# Patient Record
Sex: Female | Born: 1960 | Race: White | Hispanic: No | Marital: Married | Smoking: Former smoker
Health system: Southern US, Community
[De-identification: ages and names within clinical notes are randomized; demographics above are authoritative.]

## PROBLEM LIST (undated history)

## (undated) DIAGNOSIS — E785 Hyperlipidemia, unspecified: Secondary | ICD-10-CM

## (undated) DIAGNOSIS — M810 Age-related osteoporosis without current pathological fracture: Secondary | ICD-10-CM

## (undated) DIAGNOSIS — G43909 Migraine, unspecified, not intractable, without status migrainosus: Secondary | ICD-10-CM

## (undated) DIAGNOSIS — J45909 Unspecified asthma, uncomplicated: Secondary | ICD-10-CM

## (undated) DIAGNOSIS — K219 Gastro-esophageal reflux disease without esophagitis: Secondary | ICD-10-CM

## (undated) DIAGNOSIS — K589 Irritable bowel syndrome without diarrhea: Secondary | ICD-10-CM

## (undated) DIAGNOSIS — F329 Major depressive disorder, single episode, unspecified: Secondary | ICD-10-CM

## (undated) DIAGNOSIS — M1990 Unspecified osteoarthritis, unspecified site: Secondary | ICD-10-CM

## (undated) DIAGNOSIS — I10 Essential (primary) hypertension: Secondary | ICD-10-CM

## (undated) DIAGNOSIS — M797 Fibromyalgia: Secondary | ICD-10-CM

## (undated) DIAGNOSIS — K5790 Diverticulosis of intestine, part unspecified, without perforation or abscess without bleeding: Secondary | ICD-10-CM

## (undated) DIAGNOSIS — R519 Headache, unspecified: Secondary | ICD-10-CM

## (undated) DIAGNOSIS — F411 Generalized anxiety disorder: Secondary | ICD-10-CM

## (undated) DIAGNOSIS — G4730 Sleep apnea, unspecified: Secondary | ICD-10-CM

## (undated) DIAGNOSIS — G8929 Other chronic pain: Secondary | ICD-10-CM

## (undated) DIAGNOSIS — R51 Headache: Secondary | ICD-10-CM

## (undated) DIAGNOSIS — F32A Depression, unspecified: Secondary | ICD-10-CM

## (undated) DIAGNOSIS — K635 Polyp of colon: Secondary | ICD-10-CM

## (undated) DIAGNOSIS — F419 Anxiety disorder, unspecified: Secondary | ICD-10-CM

## (undated) DIAGNOSIS — T7840XA Allergy, unspecified, initial encounter: Secondary | ICD-10-CM

## (undated) HISTORY — DX: Unspecified asthma, uncomplicated: J45.909

## (undated) HISTORY — DX: Anxiety disorder, unspecified: F41.9

## (undated) HISTORY — DX: Gastro-esophageal reflux disease without esophagitis: K21.9

## (undated) HISTORY — PX: ELBOW SURGERY: SHX618

## (undated) HISTORY — DX: Headache: R51

## (undated) HISTORY — DX: Allergy, unspecified, initial encounter: T78.40XA

## (undated) HISTORY — PX: PARTIAL HYSTERECTOMY: SHX80

## (undated) HISTORY — DX: Sleep apnea, unspecified: G47.30

## (undated) HISTORY — DX: Diverticulosis of intestine, part unspecified, without perforation or abscess without bleeding: K57.90

## (undated) HISTORY — DX: Depression, unspecified: F32.A

## (undated) HISTORY — DX: Major depressive disorder, single episode, unspecified: F32.9

## (undated) HISTORY — DX: Polyp of colon: K63.5

## (undated) HISTORY — DX: Essential (primary) hypertension: I10

## (undated) HISTORY — DX: Age-related osteoporosis without current pathological fracture: M81.0

## (undated) HISTORY — PX: OTHER SURGICAL HISTORY: SHX169

## (undated) HISTORY — DX: Hyperlipidemia, unspecified: E78.5

## (undated) HISTORY — DX: Irritable bowel syndrome, unspecified: K58.9

## (undated) HISTORY — DX: Unspecified osteoarthritis, unspecified site: M19.90

## (undated) HISTORY — DX: Headache, unspecified: R51.9

## (undated) HISTORY — DX: Other chronic pain: G89.29

## (undated) HISTORY — PX: SHOULDER SURGERY: SHX246

## (undated) HISTORY — DX: Fibromyalgia: M79.7

---

## 1999-04-03 ENCOUNTER — Encounter: Payer: Self-pay | Admitting: Gastroenterology

## 1999-04-03 ENCOUNTER — Ambulatory Visit (HOSPITAL_COMMUNITY): Admission: RE | Admit: 1999-04-03 | Discharge: 1999-04-03 | Payer: Self-pay | Admitting: Gastroenterology

## 2001-06-21 HISTORY — PX: CHOLECYSTECTOMY: SHX55

## 2003-03-21 ENCOUNTER — Encounter: Payer: Self-pay | Admitting: *Deleted

## 2003-03-21 ENCOUNTER — Ambulatory Visit (HOSPITAL_COMMUNITY): Admission: RE | Admit: 2003-03-21 | Discharge: 2003-03-21 | Payer: Self-pay | Admitting: *Deleted

## 2003-04-02 ENCOUNTER — Inpatient Hospital Stay (HOSPITAL_COMMUNITY): Admission: AD | Admit: 2003-04-02 | Discharge: 2003-04-04 | Payer: Self-pay | Admitting: *Deleted

## 2003-04-02 ENCOUNTER — Encounter: Payer: Self-pay | Admitting: *Deleted

## 2003-04-04 ENCOUNTER — Encounter: Payer: Self-pay | Admitting: *Deleted

## 2003-04-11 ENCOUNTER — Encounter: Payer: Self-pay | Admitting: *Deleted

## 2003-04-11 ENCOUNTER — Ambulatory Visit (HOSPITAL_COMMUNITY): Admission: RE | Admit: 2003-04-11 | Discharge: 2003-04-11 | Payer: Self-pay | Admitting: *Deleted

## 2003-04-16 ENCOUNTER — Ambulatory Visit (HOSPITAL_COMMUNITY): Admission: RE | Admit: 2003-04-16 | Discharge: 2003-04-16 | Payer: Self-pay | Admitting: *Deleted

## 2004-03-10 ENCOUNTER — Emergency Department (HOSPITAL_COMMUNITY): Admission: EM | Admit: 2004-03-10 | Discharge: 2004-03-10 | Payer: Self-pay

## 2004-03-25 ENCOUNTER — Emergency Department (HOSPITAL_COMMUNITY): Admission: EM | Admit: 2004-03-25 | Discharge: 2004-03-25 | Payer: Self-pay | Admitting: Emergency Medicine

## 2005-05-12 ENCOUNTER — Encounter: Admission: RE | Admit: 2005-05-12 | Discharge: 2005-08-10 | Payer: Self-pay | Admitting: Anesthesiology

## 2005-05-18 ENCOUNTER — Ambulatory Visit: Payer: Self-pay | Admitting: Anesthesiology

## 2005-07-05 ENCOUNTER — Encounter
Admission: RE | Admit: 2005-07-05 | Discharge: 2005-10-03 | Payer: Self-pay | Admitting: Physical Medicine and Rehabilitation

## 2005-09-02 ENCOUNTER — Ambulatory Visit: Payer: Self-pay | Admitting: Physical Medicine and Rehabilitation

## 2005-09-17 ENCOUNTER — Encounter
Admission: RE | Admit: 2005-09-17 | Discharge: 2005-09-20 | Payer: Self-pay | Admitting: Physical Medicine and Rehabilitation

## 2005-09-30 ENCOUNTER — Encounter
Admission: RE | Admit: 2005-09-30 | Discharge: 2005-12-29 | Payer: Self-pay | Admitting: Physical Medicine and Rehabilitation

## 2005-10-11 ENCOUNTER — Encounter: Admission: RE | Admit: 2005-10-11 | Discharge: 2006-01-09 | Payer: Self-pay | Admitting: Anesthesiology

## 2005-10-11 ENCOUNTER — Ambulatory Visit: Payer: Self-pay | Admitting: Anesthesiology

## 2005-10-29 ENCOUNTER — Ambulatory Visit: Payer: Self-pay | Admitting: Physical Medicine and Rehabilitation

## 2005-11-18 ENCOUNTER — Encounter
Admission: RE | Admit: 2005-11-18 | Discharge: 2006-02-16 | Payer: Self-pay | Admitting: Physical Medicine and Rehabilitation

## 2005-12-28 ENCOUNTER — Encounter
Admission: RE | Admit: 2005-12-28 | Discharge: 2006-03-28 | Payer: Self-pay | Admitting: Physical Medicine and Rehabilitation

## 2005-12-28 ENCOUNTER — Ambulatory Visit: Payer: Self-pay | Admitting: Physical Medicine and Rehabilitation

## 2006-01-25 ENCOUNTER — Ambulatory Visit: Payer: Self-pay | Admitting: Physical Medicine and Rehabilitation

## 2006-01-28 ENCOUNTER — Encounter: Admission: RE | Admit: 2006-01-28 | Discharge: 2006-04-28 | Payer: Self-pay | Admitting: Anesthesiology

## 2006-02-01 ENCOUNTER — Ambulatory Visit: Payer: Self-pay | Admitting: Anesthesiology

## 2006-02-26 ENCOUNTER — Encounter
Admission: RE | Admit: 2006-02-26 | Discharge: 2006-02-26 | Payer: Self-pay | Admitting: Physical Medicine and Rehabilitation

## 2006-03-10 ENCOUNTER — Ambulatory Visit: Payer: Self-pay | Admitting: Physical Medicine and Rehabilitation

## 2006-03-29 ENCOUNTER — Encounter
Admission: RE | Admit: 2006-03-29 | Discharge: 2006-06-27 | Payer: Self-pay | Admitting: Physical Medicine and Rehabilitation

## 2006-04-05 ENCOUNTER — Ambulatory Visit: Payer: Self-pay | Admitting: Anesthesiology

## 2006-04-13 ENCOUNTER — Inpatient Hospital Stay (HOSPITAL_COMMUNITY): Admission: RE | Admit: 2006-04-13 | Discharge: 2006-04-14 | Payer: Self-pay | Admitting: Neurosurgery

## 2006-04-29 ENCOUNTER — Ambulatory Visit: Payer: Self-pay | Admitting: Physical Medicine and Rehabilitation

## 2006-05-22 ENCOUNTER — Encounter: Admission: RE | Admit: 2006-05-22 | Discharge: 2006-05-22 | Payer: Self-pay | Admitting: Neurosurgery

## 2006-05-30 ENCOUNTER — Ambulatory Visit: Payer: Self-pay | Admitting: Physical Medicine and Rehabilitation

## 2006-06-13 ENCOUNTER — Encounter: Admission: RE | Admit: 2006-06-13 | Discharge: 2006-06-13 | Payer: Self-pay | Admitting: Internal Medicine

## 2006-06-28 ENCOUNTER — Encounter
Admission: RE | Admit: 2006-06-28 | Discharge: 2006-09-26 | Payer: Self-pay | Admitting: Physical Medicine and Rehabilitation

## 2006-07-01 ENCOUNTER — Ambulatory Visit: Payer: Self-pay | Admitting: Physical Medicine and Rehabilitation

## 2006-08-26 ENCOUNTER — Ambulatory Visit: Payer: Self-pay | Admitting: Physical Medicine and Rehabilitation

## 2006-10-10 ENCOUNTER — Ambulatory Visit: Payer: Self-pay | Admitting: Physical Medicine and Rehabilitation

## 2006-10-10 ENCOUNTER — Encounter
Admission: RE | Admit: 2006-10-10 | Discharge: 2007-01-08 | Payer: Self-pay | Admitting: Physical Medicine and Rehabilitation

## 2006-12-04 ENCOUNTER — Encounter: Admission: RE | Admit: 2006-12-04 | Discharge: 2006-12-04 | Payer: Self-pay | Admitting: Unknown Physician Specialty

## 2006-12-05 ENCOUNTER — Ambulatory Visit: Payer: Self-pay | Admitting: Physical Medicine and Rehabilitation

## 2007-02-08 ENCOUNTER — Encounter: Admission: RE | Admit: 2007-02-08 | Discharge: 2007-02-08 | Payer: Self-pay | Admitting: Neurology

## 2007-02-14 ENCOUNTER — Ambulatory Visit: Payer: Self-pay | Admitting: Physical Medicine and Rehabilitation

## 2007-02-14 ENCOUNTER — Encounter
Admission: RE | Admit: 2007-02-14 | Discharge: 2007-02-15 | Payer: Self-pay | Admitting: Physical Medicine and Rehabilitation

## 2007-03-16 ENCOUNTER — Ambulatory Visit (HOSPITAL_BASED_OUTPATIENT_CLINIC_OR_DEPARTMENT_OTHER): Admission: RE | Admit: 2007-03-16 | Discharge: 2007-03-17 | Payer: Self-pay | Admitting: Orthopedic Surgery

## 2007-03-23 ENCOUNTER — Encounter: Admission: RE | Admit: 2007-03-23 | Discharge: 2007-03-24 | Payer: Self-pay | Admitting: Orthopedic Surgery

## 2007-04-11 ENCOUNTER — Encounter
Admission: RE | Admit: 2007-04-11 | Discharge: 2007-04-14 | Payer: Self-pay | Admitting: Physical Medicine and Rehabilitation

## 2007-04-14 ENCOUNTER — Ambulatory Visit: Payer: Self-pay | Admitting: Physical Medicine and Rehabilitation

## 2007-05-25 ENCOUNTER — Ambulatory Visit: Payer: Self-pay | Admitting: Physical Medicine and Rehabilitation

## 2007-05-26 ENCOUNTER — Ambulatory Visit: Payer: Self-pay | Admitting: Physical Medicine and Rehabilitation

## 2007-06-23 ENCOUNTER — Encounter
Admission: RE | Admit: 2007-06-23 | Discharge: 2007-09-21 | Payer: Self-pay | Admitting: Physical Medicine and Rehabilitation

## 2007-07-18 ENCOUNTER — Ambulatory Visit: Payer: Self-pay | Admitting: Physical Medicine and Rehabilitation

## 2007-09-04 IMAGING — CR DG CERVICAL SPINE 2 OR 3 VIEWS
4 series · 4 of 4 positions shown · non-contrast
Comparison: 04/13/06.

CLINICAL DATA: Neck pain.
 CERVICAL SPINE ? THREE LATERAL VIEWS (NEUTRAL, FLEXION, AND EXTENSION):

[view not recorded (1 of 4)]
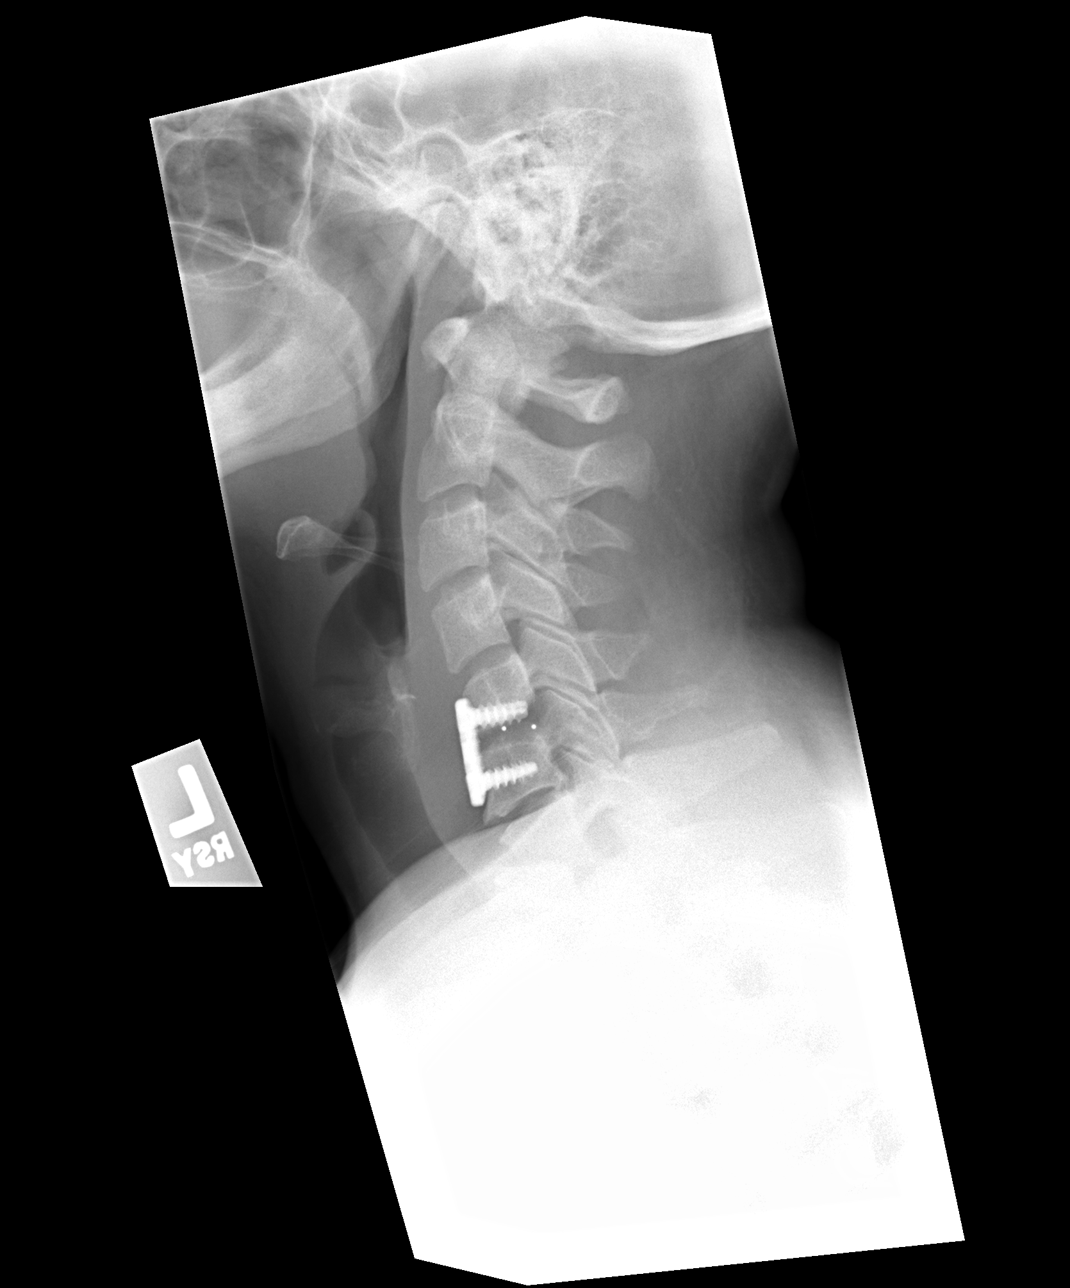

[view not recorded (2 of 4)]
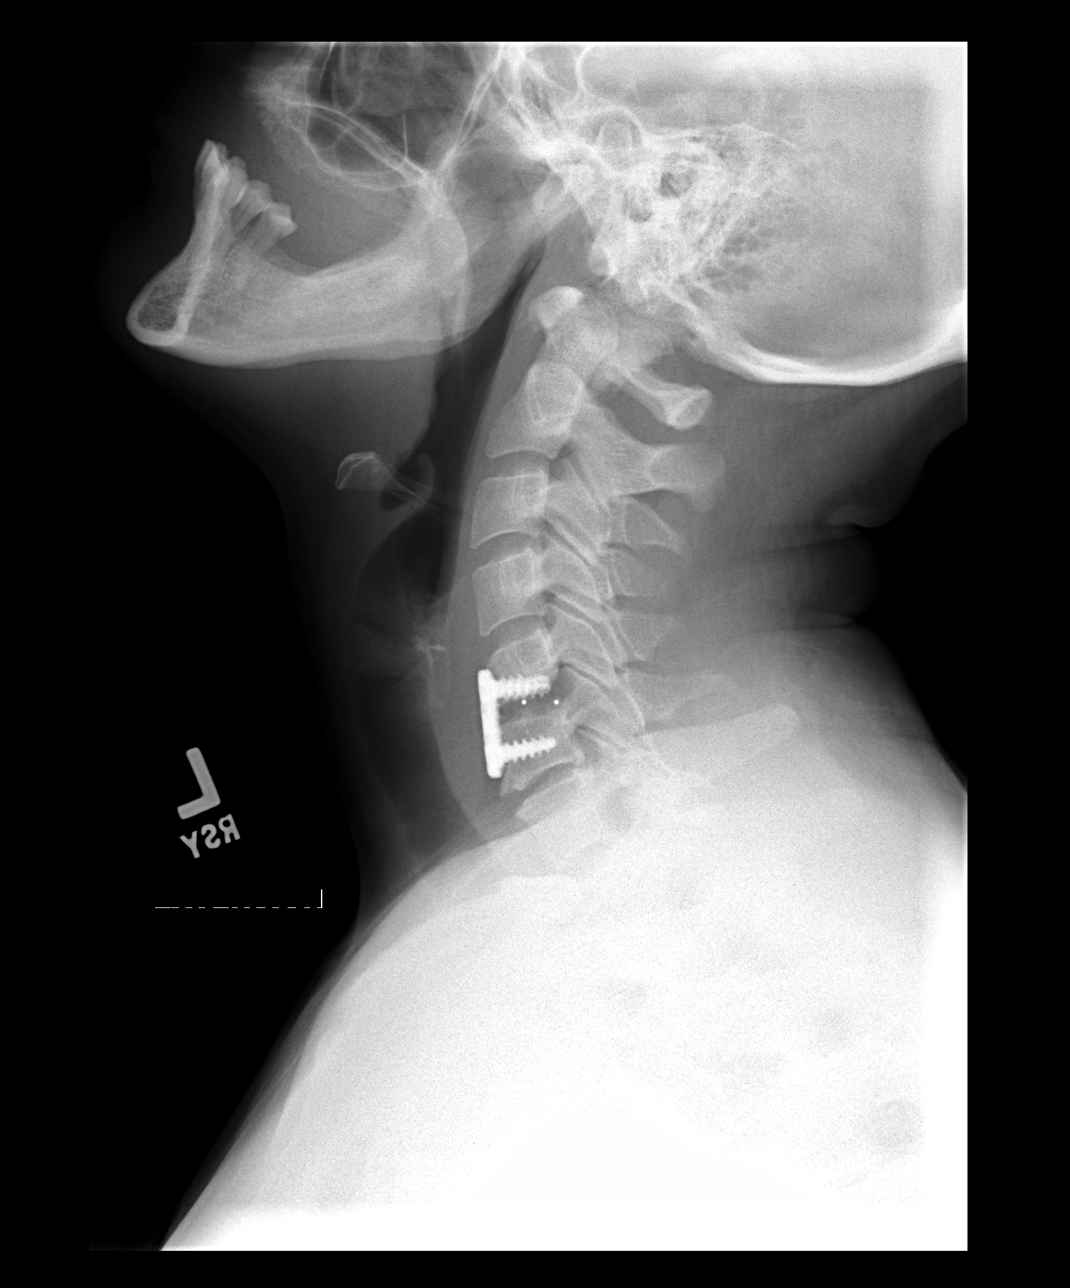

[view not recorded (3 of 4)]
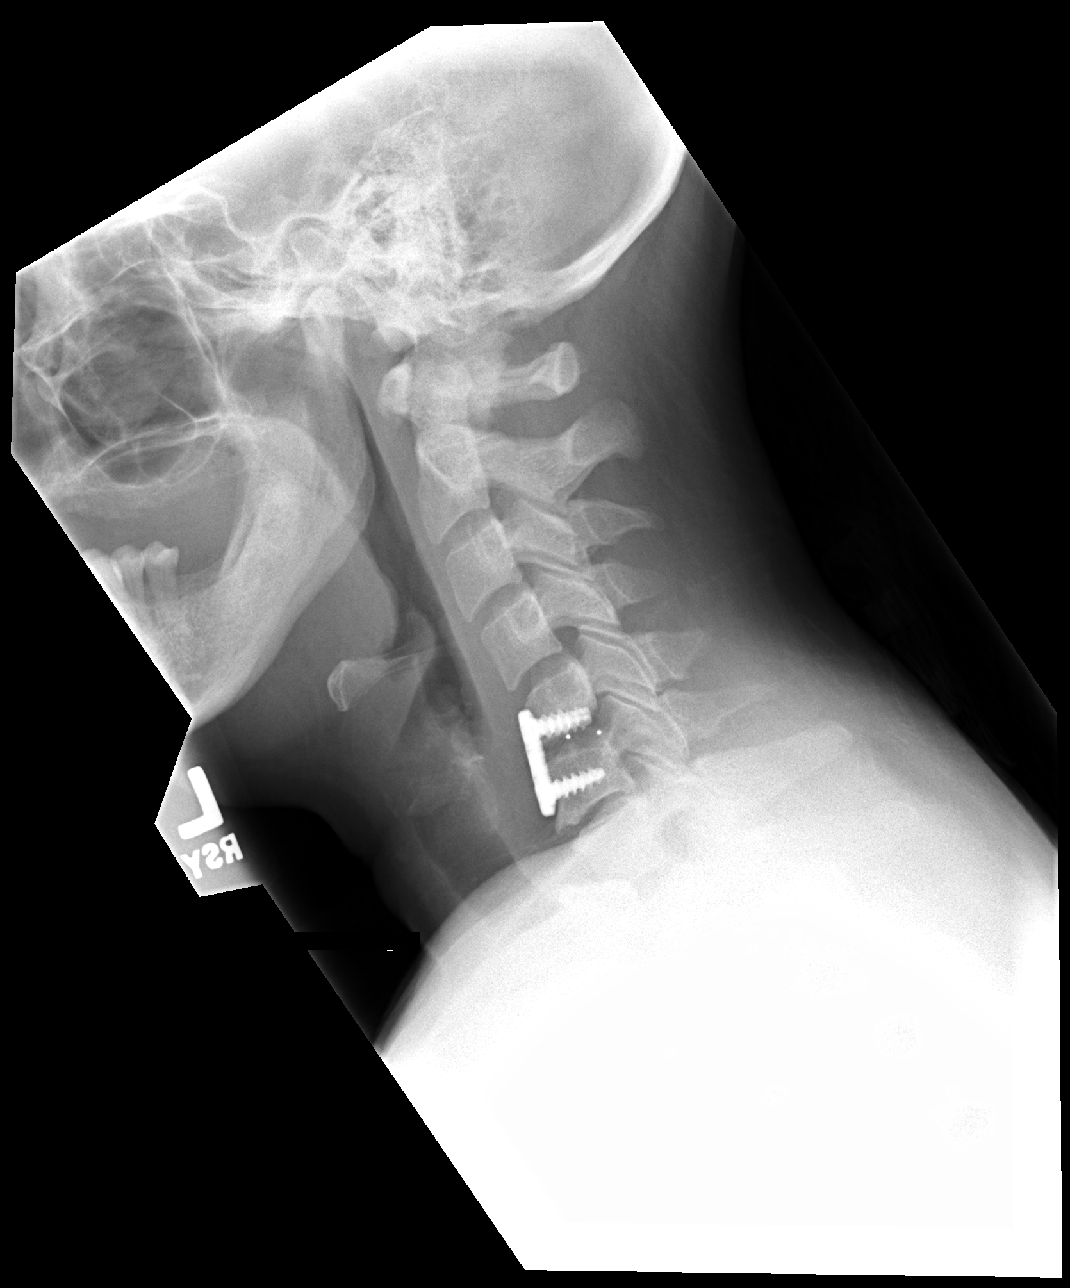

[view not recorded (4 of 4)]
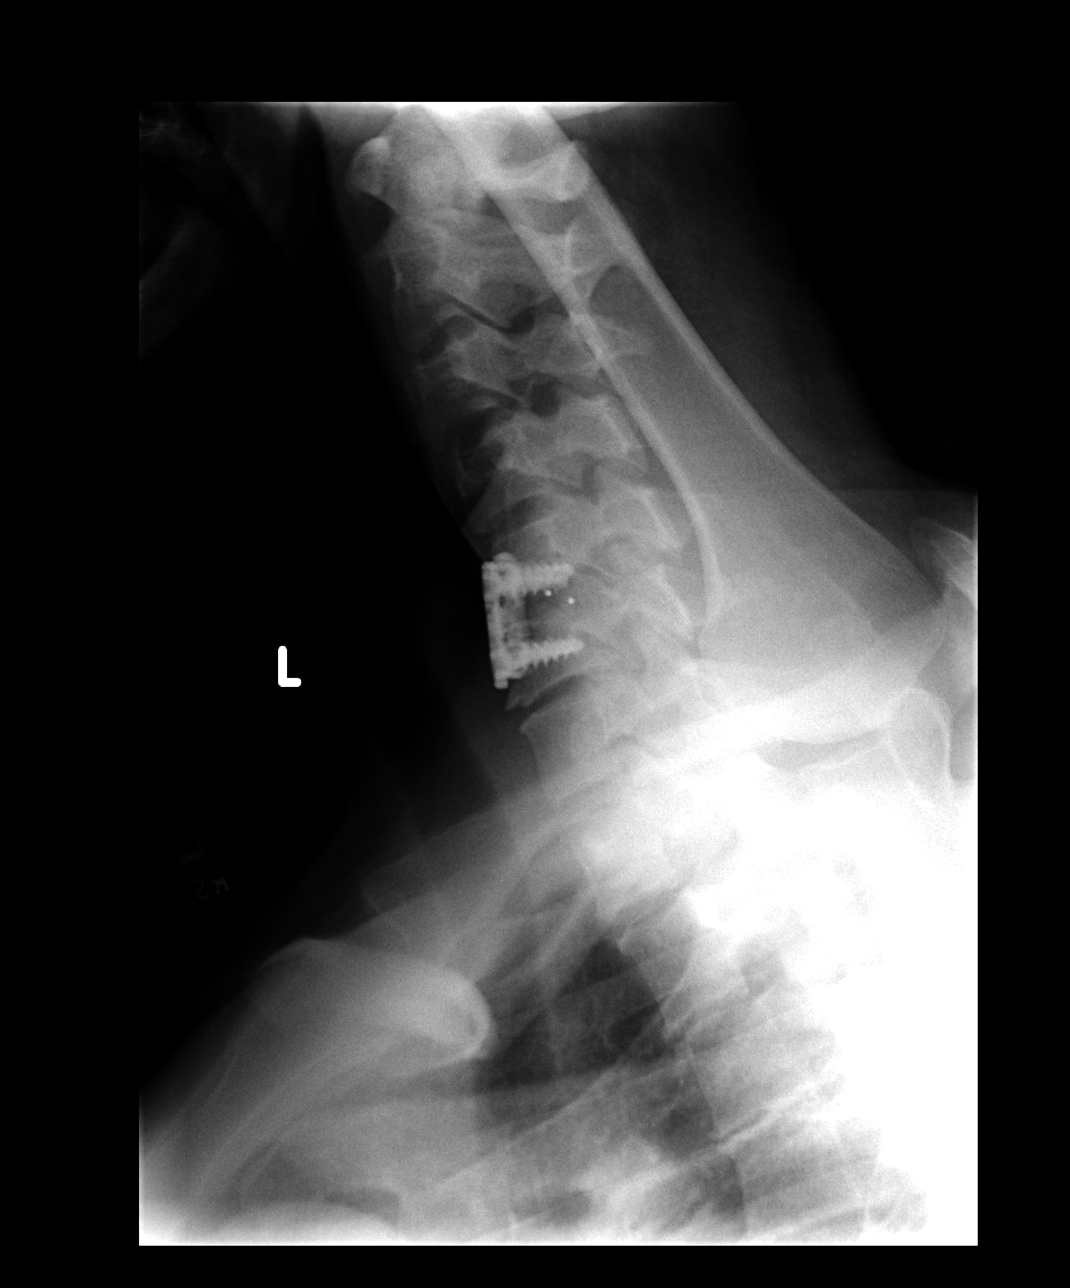

[4 of 4 positions shown; findings below may reference images not displayed]

FINDINGS: Anterior plates and screws at C5 and C6 are stable in configuration and position compared to the prior study.  A bone cage in the C5-6 disk space is stable.  Screws in C6 are appropriately positioned. The screws in the C5 area project just inferior to the vertebral body and are in the disk space.  There is anatomic alignment of the vertebral bodies.  Flexion and extension views demonstrate slightly increased mobility at the C4-5 disk as would be expected.  There is no gross instability.
IMPRESSION: 1.  Increased mobility at the C4-5 disk space as would be expected after C5-6 fusion.
 2.  C5-6 fusion and hardware are completely stable in position.  However, screws in C5 area project inferior to the C5 vertebral body and into the disk space.  These screws likely traverse the inferior aspect of C5 anteriorly but then are directed slightly inferiorly.

## 2007-09-11 ENCOUNTER — Ambulatory Visit: Payer: Self-pay | Admitting: Physical Medicine and Rehabilitation

## 2007-09-11 ENCOUNTER — Encounter
Admission: RE | Admit: 2007-09-11 | Discharge: 2007-12-10 | Payer: Self-pay | Admitting: Physical Medicine and Rehabilitation

## 2007-10-11 ENCOUNTER — Ambulatory Visit: Payer: Self-pay | Admitting: Physical Medicine and Rehabilitation

## 2007-11-08 ENCOUNTER — Ambulatory Visit: Payer: Self-pay | Admitting: Physical Medicine & Rehabilitation

## 2007-12-05 ENCOUNTER — Encounter
Admission: RE | Admit: 2007-12-05 | Discharge: 2008-03-04 | Payer: Self-pay | Admitting: Physical Medicine and Rehabilitation

## 2007-12-13 ENCOUNTER — Ambulatory Visit: Payer: Self-pay | Admitting: Physical Medicine and Rehabilitation

## 2008-02-07 ENCOUNTER — Ambulatory Visit: Payer: Self-pay | Admitting: Physical Medicine and Rehabilitation

## 2008-03-05 ENCOUNTER — Encounter
Admission: RE | Admit: 2008-03-05 | Discharge: 2008-06-03 | Payer: Self-pay | Admitting: Physical Medicine and Rehabilitation

## 2008-03-06 ENCOUNTER — Ambulatory Visit: Payer: Self-pay | Admitting: Physical Medicine and Rehabilitation

## 2008-04-05 ENCOUNTER — Ambulatory Visit: Payer: Self-pay | Admitting: Physical Medicine and Rehabilitation

## 2008-04-17 ENCOUNTER — Encounter
Admission: RE | Admit: 2008-04-17 | Discharge: 2008-04-17 | Payer: Self-pay | Admitting: Physical Medicine and Rehabilitation

## 2008-05-06 ENCOUNTER — Ambulatory Visit: Payer: Self-pay | Admitting: Physical Medicine and Rehabilitation

## 2008-05-13 ENCOUNTER — Ambulatory Visit: Payer: Self-pay | Admitting: Physical Medicine and Rehabilitation

## 2008-05-15 ENCOUNTER — Encounter
Admission: RE | Admit: 2008-05-15 | Discharge: 2008-05-15 | Payer: Self-pay | Admitting: Physical Medicine and Rehabilitation

## 2008-06-27 ENCOUNTER — Encounter
Admission: RE | Admit: 2008-06-27 | Discharge: 2008-08-21 | Payer: Self-pay | Admitting: Physical Medicine and Rehabilitation

## 2008-06-28 ENCOUNTER — Ambulatory Visit: Payer: Self-pay | Admitting: Physical Medicine and Rehabilitation

## 2008-07-05 ENCOUNTER — Ambulatory Visit (HOSPITAL_COMMUNITY): Admission: RE | Admit: 2008-07-05 | Discharge: 2008-07-05 | Payer: Self-pay | Admitting: Neurosurgery

## 2008-08-21 ENCOUNTER — Ambulatory Visit: Payer: Self-pay | Admitting: Physical Medicine and Rehabilitation

## 2008-09-16 ENCOUNTER — Encounter
Admission: RE | Admit: 2008-09-16 | Discharge: 2008-09-23 | Payer: Self-pay | Admitting: Physical Medicine & Rehabilitation

## 2008-09-23 ENCOUNTER — Ambulatory Visit: Payer: Self-pay | Admitting: Physical Medicine & Rehabilitation

## 2008-09-24 ENCOUNTER — Encounter
Admission: RE | Admit: 2008-09-24 | Discharge: 2008-11-01 | Payer: Self-pay | Admitting: Physical Medicine and Rehabilitation

## 2008-09-27 ENCOUNTER — Ambulatory Visit: Payer: Self-pay | Admitting: Physical Medicine and Rehabilitation

## 2008-10-04 ENCOUNTER — Encounter: Admission: RE | Admit: 2008-10-04 | Discharge: 2008-10-04 | Payer: Self-pay | Admitting: Sports Medicine

## 2008-10-23 ENCOUNTER — Ambulatory Visit (HOSPITAL_BASED_OUTPATIENT_CLINIC_OR_DEPARTMENT_OTHER): Admission: RE | Admit: 2008-10-23 | Discharge: 2008-10-24 | Payer: Self-pay | Admitting: Orthopedic Surgery

## 2008-11-01 ENCOUNTER — Ambulatory Visit: Payer: Self-pay | Admitting: Physical Medicine and Rehabilitation

## 2009-02-20 ENCOUNTER — Encounter: Admission: RE | Admit: 2009-02-20 | Discharge: 2009-02-20 | Payer: Self-pay | Admitting: Orthopedic Surgery

## 2009-02-28 ENCOUNTER — Ambulatory Visit: Payer: Self-pay | Admitting: Physical Medicine and Rehabilitation

## 2009-02-28 ENCOUNTER — Encounter
Admission: RE | Admit: 2009-02-28 | Discharge: 2009-02-28 | Payer: Self-pay | Admitting: Physical Medicine and Rehabilitation

## 2009-04-10 ENCOUNTER — Encounter: Admission: RE | Admit: 2009-04-10 | Discharge: 2009-04-10 | Payer: Self-pay | Admitting: Orthopedic Surgery

## 2009-05-19 ENCOUNTER — Encounter: Admission: RE | Admit: 2009-05-19 | Discharge: 2009-05-19 | Payer: Self-pay | Admitting: Orthopedic Surgery

## 2010-05-09 ENCOUNTER — Emergency Department (HOSPITAL_COMMUNITY): Admission: EM | Admit: 2010-05-09 | Discharge: 2010-05-09 | Payer: Self-pay | Admitting: Emergency Medicine

## 2010-07-11 ENCOUNTER — Encounter: Payer: Self-pay | Admitting: *Deleted

## 2010-07-12 ENCOUNTER — Encounter: Payer: Self-pay | Admitting: Orthopedic Surgery

## 2010-07-12 ENCOUNTER — Encounter: Payer: Self-pay | Admitting: Family Medicine

## 2010-07-21 ENCOUNTER — Ambulatory Visit (HOSPITAL_COMMUNITY)
Admission: RE | Admit: 2010-07-21 | Discharge: 2010-07-21 | Payer: Self-pay | Source: Home / Self Care | Attending: Psychiatry | Admitting: Psychiatry

## 2010-07-24 ENCOUNTER — Ambulatory Visit (HOSPITAL_COMMUNITY): Payer: Medicare Other | Admitting: Physician Assistant

## 2010-07-24 DIAGNOSIS — F4310 Post-traumatic stress disorder, unspecified: Secondary | ICD-10-CM

## 2010-08-25 ENCOUNTER — Encounter (HOSPITAL_COMMUNITY): Payer: Medicare Other | Admitting: Physician Assistant

## 2010-08-26 ENCOUNTER — Encounter (HOSPITAL_COMMUNITY): Payer: Medicare Other | Admitting: Physician Assistant

## 2010-08-26 DIAGNOSIS — F410 Panic disorder [episodic paroxysmal anxiety] without agoraphobia: Secondary | ICD-10-CM

## 2010-08-26 DIAGNOSIS — F329 Major depressive disorder, single episode, unspecified: Secondary | ICD-10-CM

## 2010-09-01 LAB — DIFFERENTIAL
Eosinophils Absolute: 0.1 10*3/uL (ref 0.0–0.7)
Eosinophils Relative: 3 % (ref 0–5)
Lymphocytes Relative: 39 % (ref 12–46)
Lymphs Abs: 1.9 10*3/uL (ref 0.7–4.0)
Monocytes Absolute: 0.6 10*3/uL (ref 0.1–1.0)
Monocytes Relative: 12 % (ref 3–12)

## 2010-09-01 LAB — CBC
HCT: 35.7 % — ABNORMAL LOW (ref 36.0–46.0)
Hemoglobin: 11.6 g/dL — ABNORMAL LOW (ref 12.0–15.0)
MCH: 27.9 pg (ref 26.0–34.0)
MCV: 85.8 fL (ref 78.0–100.0)
Platelets: 177 10*3/uL (ref 150–400)
RBC: 4.16 MIL/uL (ref 3.87–5.11)
WBC: 4.8 10*3/uL (ref 4.0–10.5)

## 2010-09-01 LAB — POCT I-STAT, CHEM 8
BUN: 12 mg/dL (ref 6–23)
Calcium, Ion: 1.12 mmol/L (ref 1.12–1.32)
Creatinine, Ser: 0.9 mg/dL (ref 0.4–1.2)
Glucose, Bld: 96 mg/dL (ref 70–99)
Hemoglobin: 12.6 g/dL (ref 12.0–15.0)
Sodium: 143 mEq/L (ref 135–145)
TCO2: 26 mmol/L (ref 0–100)

## 2010-09-23 ENCOUNTER — Encounter (HOSPITAL_COMMUNITY): Payer: Medicare Other | Admitting: Physician Assistant

## 2010-09-23 DIAGNOSIS — F410 Panic disorder [episodic paroxysmal anxiety] without agoraphobia: Secondary | ICD-10-CM

## 2010-09-23 DIAGNOSIS — F329 Major depressive disorder, single episode, unspecified: Secondary | ICD-10-CM

## 2010-11-03 ENCOUNTER — Encounter (HOSPITAL_COMMUNITY): Payer: Medicare Other | Admitting: Physician Assistant

## 2010-11-03 DIAGNOSIS — F410 Panic disorder [episodic paroxysmal anxiety] without agoraphobia: Secondary | ICD-10-CM

## 2010-11-03 DIAGNOSIS — F329 Major depressive disorder, single episode, unspecified: Secondary | ICD-10-CM

## 2010-11-03 NOTE — Assessment & Plan Note (Signed)
Amber Harrell is a 50 year old married woman who has followed in [facility] for chronic cervicalgia and bilateral shoulder  pain.   She was last seen by me on May 13, 2008.   In the interim, she has followed up with Dr. Tressie Stalker who has  ordered a myelogram on July 06, 2007, next week.   Amber Harrell continues to have fairly severe pain in her neck and her  shoulders.  Pain radiates down both upper extremities.  She reports  numbness and tingling in both hands and arms.  She is also complaining  of some tingling and pain in the lower extremities as well now.   She is reporting some frequency, symptoms with respect to bladder, has  no problems controlling bowel.   She reports poor sleep.  Pain is described as constant, sharp burning,  tingling, and aching in nature.  She reports pain is worse with bending,  prolonged sitting, improves with heat, medication for a little while.  She is getting fair relief with current meds.   Medications prescribed through this clinic include the following:  1. Norco 5/325 one p.o. q.i.d. p.r.n. pain.  These were filled for her      on June 18, 2008.  She has 112 pills left today.  2. Topamax 25 mg 2 tablets p.o. b.i.d.  She is currently off Celebrex.  3. Trazodone 50 mg one p.o. nightly  4. Flexeril 5 mg 1 p.o. b.i.d.   Functional status is as follows.  She is able to walk at least 15-20  minutes at a time.  She is able to drive.  She is able to climb stairs.  She is independent with feeding, bathing, and toileting, needs some  assistance with upper extremity dressing, needs assistance with meal  prep and household duties.   Review of systems is positive for depression, anxiety, denies suicidal  ideation, admits to numbness, tingling, and bladder frequency.   No other changes in past medical, social or family history.  She has  been healthy since I saw her last other than the neck and arm pain.   PHYSICAL  EXAMINATION:  Blood pressure is 125/68, pulse 56, respiration  18, 98% saturated on room air.  She is a well-developed, nourished woman  who appears her stated age.  She is oriented x3.  Speech is clear.  Affect is bright.  She is alert, cooperative, and pleasant.  She follows  commands without difficulty and answers questions appropriately.  She  does display pain behaviors throughout the exam.  Cranial nerves are  grossly intact.  Coordination is intact.  Reflexes are slightly brisk in  upper and lower extremities.  No abnormal tone is noted.  No clonus is  noted.  Decreased sensation to pinprick in the left middle finger and  over the left bicipital region.  Otherwise, intact in the right upper  extremity and bilateral lower extremities.  Motor strength is in the  4+/5 range in the upper extremities, unable to give full effort because  of pain in the neck and shoulders today, distally in the upper  extremities she is 5/5.  Lower extremity strength is 5/5.   Transitioning from sitting to standing is done without difficulty.  Gait  is nonantalgic.  Balance is quite good.  In fact, she is wearing rocker  bottom she use today, apparently a gift from her husband.  She is able  to tandem walk in these shoes and also able to  perform an adequate  Romberg test.   Limitations are noted in cervical range of motion in all planes,  especially limited is left rotation and forward flexion.  Left shoulder  range of motion is limited to less than 90 degrees.  Right shoulder  motion is intact with abduction and internal and external rotation.   IMPRESSION:  1. History of cervicalgia status post cervical decompression, Dr.      Lovell Sheehan, C5-6, May 14, 2006.  2. Degenerative changes also noted at other levels per MRI recently.  3. Bilateral upper extremity pain.  4. History of migraines.  5. History of insomnia.  6. History of anxiety.   PLAN:  The patient will be following back up with Dr.  Lovell Sheehan.  She has  a myelogram planned.  We will refill the following medications for her  today:  Topamax 25 mg 2 p.o. b.i.d. #120 with 2 refills and Flexeril 5  mg 1 p.o. b.i.d. #60.  She does not need refill on her hydrocodone at  this time.  Anticipate she will need a refill near the end of the month.  Ms. Stobaugh has been stable on the above medications.  She has been  taking them as prescribed.  No aberrant behavior has been observed.           ______________________________  Brantley Stage, M.D.     DMK/MedQ  D:  06/28/2008 12:10:46  T:  06/29/2008 01:24:21  Job #:  [id number]

## 2010-11-03 NOTE — Assessment & Plan Note (Signed)
Amber Harrell is a 50 year old married female who is followed in our [facility] for cervicalgia and right shoulder pain.   She was last seen by me on Nov 08, 2007.  She is back in today for  refill of her medications for Topamax.   She states, over the last couple of days, she has experienced some  increase in her shoulder pain and some tingling down the right upper  extremity.  She has had neck cramps as well for about a week, and she  has not been sleeping quite as well.  She believes the neck pain and arm  pain started after she was helping to clean out her church this spring.  She has no new problems with control of bowel or bladder.  Denies any  new problems or trouble walking.   Average pain is about a 7 on a scale of 10 described as dull and aching,  interfering significantly with activity, sleep tends to be poor  recently.  She gets fair relief with current meds that she is  prescribed.   MEDICATIONS:  Medications, which she takes through our clinic include:  1. Celebrex p.r.n.  She is out of this medication.  2. Topamax 25 mg 3 times a day.  3. Trazodone 15 mg one p.o. nightly.   Functional status is as follows.   She is able to walk about 15 minutes at a time.  She is able to climb  stairs, and she is driving.  She is independent with self-care.  She is  a high level functioning individual, is independent with meal prep,  shopping, needs some help with heavier household tasks.   REVIEW OF SYSTEMS:  She initially checked the bowel control problem box  in our health and history form, but she states that she is really not  having problems with this, does admit to some depression and anxiety.  Denies suicidal ideation, reports that she had CT scan of her lungs  approximately Nov 06, 2007, and was told by her cardiologist, Dr. Dulce Sellar  that she has emphysema now.   Otherwise, no new changes in past medical, social, or family history  since last visit  other than what I just described.   PHYSICAL EXAMINATION:  Blood pressure is 112/69, pulse 59, respirations  18, 100% saturation on room air.  She is a well-developed, well-  nourished woman who does not appear in any distress.  She is oriented  x3.  Speech is clear.  Affect is bright.  She is alert and cooperative  and pleasant.  She follows commands easily.   Cranial nerves are grossly intact.  Her motor strength is 5/5 without  focal deficit.  She has intact and symmetric reflexes in the upper and  lower extremities without abnormal tone or clonus.  Toes are downgoing.   Coordination is grossly intact.  Tandem gait, Romberg test all performed  adequately, gait is normal.   Musculoskeletal exam reveals right shoulder range of motion to 110  degrees.  She reports some discomfort with this.   She has mild limitations in cervical range of motion, quite a bit of  tenderness in the cervical paraspinal musculature as well as the upper  trapezius muscles.   IMPRESSION:  1. Cervicalgia.  2. Chronic right shoulder pain, status post surgery x2.  3. History of migraines.  4. Insomnia.   PLAN:  I would like her to wear her cervical collar over the next week  while  she is up.  We will start her back on Celebrex 200 mg daily for  the next 7-10 days, prescription was given for #30.  We will refill her  Topamax 25 mg one p.o. t.i.d. #90 with 3 refills.  She does not need a  refill on the trazodone at this time.  She will let us know if she  continues to have problems.  Otherwise, we will see her back in 2  months.           ______________________________  Brantley Stage, M.D.     DMK/MedQ  D:  12/13/2007 11:33:28  T:  12/14/2007 12:50:20  Job #:  [id number]

## 2010-11-03 NOTE — Assessment & Plan Note (Signed)
Amber Harrell is a 50 year old married woman who is followed in our [facility] for chronic cervicalgia and shoulder pain.   She was last seen by me on May 06, 2008, which was last week.  At  that time, she had had increasing pain in the cervical region with  radiation to the shoulders bilaterally, especially to the left bicipital  region.   She has a history significant for cervical decompression by Dr. Lovell Sheehan  at C5-C6.  She has history of degenerative changes noted at C6-C7 as  well.   She has now about an 8- to 10-week history of significant pain in the  cervical and shoulder or parascapular regions.  Last week, she was  started on a Medrol Dosepak, and an MRI was ordered of her cervical  spine; however, she was ill with flu symptoms and did not obtain the  cervical spine MRI.  This was then rescheduled for May 16, 2008.  She has also made an appointment to see Dr. Lovell Sheehan on May 15, 2008.   She states her pain is about a 10 on a scale of 10; it is constant  throughout the day without really waxing or waning.   She denies any new weakness, any new numbness or tingling.  Pain is  really not worsened by any particular activity.  She does report some  improvement of her pain with medications as well as TENS unit and  Lidoderm patches.   Medications, which are prescribed through this clinic for her include  Topamax 25 mg 1 p.o. q.8 h., trazodone 50 mg 1 p.o. at bedtime, p.r.n.  Voltaren gel, Flexeril 5 mg 1 p.o. b.i.d. as well as Norco as prescribed  for last week, she was given 32 tablets to take 1 p.o. q.i.d.   FUNCTIONAL STATUS:  She is limited being upright now.  She can walk  approximately 10 minutes.  She is able to climb stairs.  She is  currently not driving.  She requires some assistance with upper  extremity dressing, getting her through her shirt and washing her hair.   Denies problems controlling bowel or bladder.  Admits to  depression and  anxiety.  Denies suicidal ideation.   REVIEW OF SYSTEMS:  Positive for recent vomiting and constipation,  abdominal pain, respiratory infections, and coughing.  She does maintain  contact with her primary care physician.   No other changes in past medical, social, or family history since our  last visit last week.   PHYSICAL EXAMINATION:  VITALS:  Today, blood pressure is 115/59, pulse  68, respiration 18, and 98% saturated on room air.  GENERAL:  She is a well-developed, well-nourished female who appears in  no distress.   She is oriented x3.  Her speech is clear.  Her affect is bright.  She is  alert, cooperative, and pleasant.  She follows commands without  difficulty.   She has limitations in cervical range of motion.  Rotation is to about  30 degrees on the right and 25 degrees on the left.  She has limitations  in the shoulder range of motion at approximately 110 degrees  bilaterally.  She complains of pain throughout the posterior cervical  region as well as into the scapula while she performs these actions.   Her reflexes are slightly brisk in the upper extremities as well as the  lower extremities.  There is no clonus noted, however.  No abnormal tone  is noted.  Her toes  are downgoing with respect to Babinski's.   Sensation is intact in the distal upper extremities bilaterally.  She  appears to have some decreased sensation in the C5 dermatomes  bilaterally.   Motor strength is difficult to assess secondary to pain.  Currently,  distally the biceps, triceps, brachioradialis, finger flexors, and  intrinsic appeared to be 5/5.  Testing musculature in the region of the  shoulder is difficult because it does increase her pain.  Shoulder  abduction is about 4/5 bilaterally.  External rotation is about 4-/5  bilaterally.   Her gait is without antalgia, has normal gait.  Tandem gait and Romberg  test are also performed adequately.   IMPRESSION:  1.  Cervicalgia with radiation to the left biceps region.  2. History of cervicalgia status post cervical decompression, Dr.      Lovell Sheehan, C5-C6, May 14, 2006.  3. Degenerative changes noted at C6-C7.  4. History of migraines.  5. History of insomnia.  6. History of anxiety.  7. Bilateral shoulder/upper extremity pain, now to the biceps, x about      8-10 weeks.   MRI had been ordered last week.  She was unable to carry out the scan  due to illness last week.  It is rescheduled now for May 16, 2008.  Followup appointment with Dr. Lovell Sheehan planned on May 15, 2008.  We  will increase her Topamax slightly to 25 mg 2 p.o. b.i.d., #120 with a  refill, and we will start her on some hydrocodone/Norco 5/325 one p.o.  q.i.d., #120.  We will see her back in 4 weeks.  She will let me know if  there are any problems in the interim.           ______________________________  Brantley Stage, M.D.     DMK/MedQ  D:  05/13/2008 10:32:16  T:  05/14/2008 01:36:28  Job #:  [id number]

## 2010-11-03 NOTE — Assessment & Plan Note (Signed)
She is a 50 year old married woman is followed in our [facility] for chronic neck and shoulder pain.  She was last  seen by me on February 07, 2008.  She stepped in today and states that her  left lateral neck and shoulder as well as down the lateral arm and into  the dorsum of the left hand has been bothering her for a couple of weeks  now.  She is not sure if she slept wrong on it.  She denies any kind of  trauma or any kind of inciting aggravating events that may have brought  it on.   Her average pain is about 8 on a scale of 10, however in clinic today,  she states her pain is a little bit better about 6 on a scale of 10.  The pain has described as self-tingling and aching.  She has not taken  any antiinflammatory medications.  She has been using some Voltaren  cream, which has not helped a great deal.  She has not trialed her  cervical collar as well.  She has been rather active despite the neck  pain.   Pain is typically worse in the morning and toward the end of the day.  Her sleep is fair.  Pain is worse with bending and activities which  involve moving her neck and arms such as driving.  The pain does improve  with medications.  She has fair relief with current meds prescribed at  our clinic.   MEDICATIONS:  From the [facility] include:  1. P.r.n. Celebrex 200 mg one p.o. daily.  She has currently not been      taking this.  2. Topamax 25 mg q.8 hours.  3. Trazodone 50 mg one tablet at night.  4. Voltaren gel on a p.r.n. basis.   FUNCTIONAL STATUS:  She is able to walk at least 10 minutes at a time.  She is able to drive.  She is independent with self-care.  She is doing  higher-level household activities as well.  She has been disabled since  2004.   REVIEW OF SYSTEMS:  Negative for problems controlling bowel or bladder.  Denies any new problems with balance and reports occasional dizziness,  depression, and anxiety.  Denies  suicidal ideation.  Review of systems  is also positive for nausea, diarrhea, or constipation, abdominal pain,  poor appetite, and sleep apnea.  I asked her to follow up with the  primary care physician for these concerns.   No other changes in past medical, surgical, and family history since the  last visit.   PHYSICAL EXAMINATION:  VITAL SIGNS:  Blood pressure 148/63, pulse 65,  respirations 20, and O2 saturation is 99% on room air.  GENERAL:  She is a well-developed well-nourished female who appears well-  dressed with neat hair and wearing an 1-inch high heel.  She is oriented  x3.  She is alert, cooperative, and pleasant.  She follows commands  without any difficulties.  Her speech is clear.  Her affect is bright.  NEURO:  Cranial nerves are grossly intact.  Coordination is intact.  Reflexes are 2+ at the patellar and Achilles tendon.  2+ at biceps,  triceps, and brachioradialis, symmetric throughout.  No abnormal tone is  noted.  No clonus is noted.  Sensation is intact in the right and left upper extremities.  Motor  strength is 5/5 with the exception of left shoulder abduction and left  wrist  extension she reports too much pain to give me full strength on  the exam today.  The rest of her manual muscle testing does not reveal  any focal deficits.  She has limitations in cervical range of motion.  She has limitations in shoulder range of motion bilaterally to about 90  degrees.  However, overall balance is good.  Gait is normal.  Romberg  test is performed adequately as well.  Tenderness throughout the  cervical spinal muscles are noted including her scapular region as well  as tenderness into the last deltoid and biceps regions.   IMPRESSION:  1. Cervicalgia status post cervical decompression by Dr. Lovell Sheehan,      chronic right shoulder pain status post surgery x2.  2. History of migraine.  3. Insomnia.  4. History of anxiety.   PLAN:  Given increased neck pain over the  last couple of weeks and some  radiation in to the left upper extremity we recommend and restarting use  of her soft cervical collar for the next few days.  I instructed her to  discontinue her Voltaren gel for now and take her Celebrex regularly 2  tablets a day, 1 tablet at least day for the next 7 days or so.  I have  asked her to avoid an activities, which involve significant flexion,  extension, or rotation of her neck and so that things calm down for her.  She will let me know how she is doing.  We will see her back otherwise  in a month.  She has been stable on the above medications and does not  display any aberrant behavior, takes them as prescribed.  She does  obtain tramadol through Dr. Ardelle Park.           ______________________________  Brantley Stage, M.D.     DMK/MedQ  D:  03/06/2008 13:50:13  T:  03/07/2008 04:47:45  Job #:  [id number]   cc:   Donnel Saxon  Fax: [phone]

## 2010-11-03 NOTE — Op Note (Signed)
NAMEXITLALLI, NEWHARD NO.:  [id number]   MEDICAL RECORD NO.:  [id number]          PATIENT TYPE:  AMB   LOCATION:  [facility]                          FACILITY:  [facility]   PHYSICIAN:  Loreta Ave, M.D. DATE OF BIRTH:  May 16, 1961   DATE OF PROCEDURE:  10/23/2008  DATE OF DISCHARGE:  10/24/2008                               OPERATIVE REPORT   PREOPERATIVE DIAGNOSES:  1. Left shoulder impingement.  2. Distal clavicle osteolysis.  3. Labrum tear.  4. Marked adhesive capsulitis.   POSTOPERATIVE DIAGNOSES:  1. Left shoulder impingement.  2. Distal clavicle osteolysis.  3. Labrum tear.  4. Marked adhesive capsulitis.   PROCEDURE:  1. Left shoulder exam under anesthesia with manipulation.  2. Arthroscopy with debridement of intra and extraarticular adhesion.  3. Debridement of the capsular tearing produced intentionally by      manipulation.  4. Debridement of labrum tear and some small bony fragments of the      front of the glenoid.  5. Bursectomy, acromioplasty, and coracoacromial ligament release.  6. Excision of distal clavicle, all arthroscopic.   SURGEON:  Loreta Ave, MD   ASSISTANT:  Genene Churn. Barry Dienes, [place]   ANESTHESIA:  General.   BLOOD LOSS:  Minimal.   SPECIMENS:  None.   CULTURES:  None.   COMPLICATIONS:  None.   DRESSINGS:  Soft compressive with sling.   PROCEDURE:  The patient was brought to the operating room, and after  adequate anesthesia had been obtained, the shoulder was examined.  Profound reduced motion with very firm endpoint.  Barely 90 degrees of  forward flexion and abduction was marked to eliminate rotation.  Shoulder manipulated, breakup adhesions during capsular release gently,  but to achieve full motion maintaining stable shoulder.  Once I was  confirmed, placed in a beach-chair position on the shoulder positioner,  and prepped and draped in usual sterile fashion.  Three portals are  anterior, posterior, and lateral.   Shoulder entered with blunt  obturator.  Arthroscope introduced.  The shoulder distended and  inspected.  Tearing at the front of the capsule and inferior capsule  done intentionally because of the degree of capsular contraction from  adhesive capsulitis.  Intraarticular adhesions debrided.  Superior  posterior labrum tear was debrided.  Some small bony fragments of the  front of the glenoid from releasing the capsule debrided.  Articular  cartilage, biceps tendon, biceps anchor, undersurface of cuff intact.  Cannula redirected subacromially.  We had the bursitis debrided.  Some  abrasive changes on top of the cuff, but no functional tears.  Type 2  acromium.  Bursa resected.  Acromioplasty to a type 1 acromion with  shaver and high-speed bur, released CA ligament with cautery.  Distal  clavicle grade 3 changes with osteolysis and cyst.  Periarticular spurs  and lateral centimeter of clavicle resected.  The adequacy of  decompression of the clavicle incision confirmed viewing from all  portals.  Instruments and fluid removed.  Portals of shoulder and bursa  injected with Marcaine.  Portals closed with 4-0 nylon.  Sterile  compressive dressing applied.  Sling applied.  Anesthesia reversed.  Brought to the recovery room.  Tolerated the surgery well.  No  complications.      Loreta Ave, M.D.  Electronically Signed     DFM/MEDQ  D:  10/24/2008  T:  10/24/2008  Job:  [id number]

## 2010-11-03 NOTE — Assessment & Plan Note (Signed)
HISTORY OF PRESENT ILLNESS:  Amber Harrell is a 50 year old married woman  who has been followed in our [facility] for chronic  neck and shoulder pain and left upper extremity pain.   She was last seen by me in August 21, 2008.  She is back in today  requesting refills of her Topamax, Celebrex, and Flexeril.  She does not  need to refill on her hydrocodone at this time.   Her average pain has been about a 7 on a scale of 10, predominately  localized to the posterior cervical region, bilateral shoulders, and  down the left arm.  She complains of numbness and tingling and weakness.  In the interim, she has had electrodiagnostic studies completed by Dr.  Wynn Banker, test date September 23, 2008.  Impression of the electrodiagnostic  study, essentially normal studies.  These results were related to her  answers to questions regarding this study.   She also states that in the interim she has seen Dr. Brent Bulla.  He has  given her an injection to her left shoulder, which she room reports did  not really help her pain and she has started on physical therapy to  address shoulder pain complaints.   She was also seen in Dr. Stefanie Libel office and she was given a Medrol  Dosepak.   She reports good relief with current pain meds.  At this time, pain is  described as constant, tingling, aching, dull, stabbing, sharp in nature  depending on the activity that she is involved in.  Activities which  require her to use her left upper extremity exacerbates her pain.   She is independent with self care.  She can walk independently.  She  states she no longer does much cooking.  She admits to some depression  and anxiety.  Denies suicidal ideation.   REVIEW OF SYSTEMS:  Otherwise, negative.   No changes otherwise in past medical, social, family history other than  that previously noted.   MEDICATIONS:  Medications provided through this clinic include,  1. Norco 5/325 up to four times a day, pill  count today was 80, 120s      were written for on March 22.  2. Topamax 25 mg 2 tablets b.i.d.  3. Celebrex 200 mg daily p.r.n.  4. Trazodone 50 mg nightly.  5. Voltaren Gel p.r.n.  6. Flexeril 5 mg p.o. b.i.d.   PHYSICAL EXAMINATION:  Her blood pressure is 114/69 today, pulse 58,  respirations 18, 100% saturated on room air.   She is a thin adult female who does not appear in any distress.  She is  oriented x3.  Speech is clear.  Affect is bright.  She is alert,  cooperative, and pleasant.  Follows commands without difficulty.  Answers questions appropriately.   Cranial nerves are grossly intact.  Coordination is intact.  She reports  diminished sensation throughout the entire left upper extremity, intact  in the right upper extremity as well as bilateral lower extremity.  Reflexes are 2+ at bilateral biceps, triceps, brachioradialis.  Reflexes  2+ at patellar tendon, 0 the right ankle, and 1+ at the left ankle.   No abnormal tone is noted.  No clonus is noted.  No tremors are  appreciated.   Muscle strength is 5/5 in the right upper and bilateral lower  extremities, diminished in the left upper extremity secondary to pain  complaints during effort.   Mild limitations in the cervical range of motion are noted.  She  has  significant limitations in bilateral shoulders worse on the left than on  the right.  On the right, she is able to abduct to about 100 degrees, 80  degrees of internal rotation, 20 of external rotation on the left,  abduction to about 90 degrees, 70 degrees of internal rotation, 10  degrees of external rotation.   She is able to transition easily from sitting to standing.  Gait is  nonantalgic.  Tandem gait. Romberg test are all performed adequately.   IMPRESSION:  1. Chronic left upper extremity pain, multifactorial in nature.  2. History of cervicalgia status post cervical decompression by Dr.      Lovell Sheehan, C5-6, May 14, 2006.  3. History of right  shoulder surgery.  4. Left shoulder pain and diminished range of motion.  5. History of migraines.  6. History of anxiety and depression.   PLAN:  Encouraged her to followup with her course of physical therapy  which has been prescribed by Dr. Brent Bulla.  We will refill the following  medications, Topamax 25 mg two p.o. b.i.d., Celebrex 200 mg p.o. daily  p.r.n., and Flexeril 5 mg one p.o. b.i.d.  She does not need a refill on  her Norco at this time.  She will call later in the month for that.  She  has 80 tablets left as of today.  We will continue to monitor her  narcotic use and monitor her on medications provided through this  clinic.  We will see her back in a month.           ______________________________  Brantley Stage, M.D.     DMK/MedQ  D:  09/27/2008 13:52:07  T:  09/28/2008 05:22:06  Job #:  [id number]   cc:   Donnel Saxon  Fax: [phone]   Dr. Brent Bulla

## 2010-11-03 NOTE — Assessment & Plan Note (Signed)
[name] is a 50 year old married female, who is being seen in our  pain and rehab clinic for chronic neck, shoulder and right-arm pain.   She was last seen in clinic on February 15, 2007.   In the interim, she states that she saw a second opinion for her  shoulder pain.  Apparently, she saw Dr. Eulah Pont.  I do not have any notes  regarding her visit.  Apparently, she has undergone another shoulder  surgery.  She states that they took out scar tissue, manipulated it and  took out some spurs.   She is back in today and reports her average pain is down to a 6 on a  scale of 10.  Her pain has been significantly improved after this last  surgery.  She is currently engaging in physical therapy.   She is getting some Oxycodone through Dr. Ardelle Park.  She is taking it three  times a day at this point.  Apparently, Dr. Ardelle Park is going to be weaning  her slowly from this over the next few weeks.   She is getting good relief with the current meds that she is on.  She is  also using Topamax, which has been given through this clinic.  She takes  25 mg twice a day, has not had any problems with this, and she does feel  it seems to be assisting in decreasing her overall pain, as well.   She is able to walk about 20 minutes at a time.  She is able to climb  stairs and drive.  She is engaging in a therapy program currently.  She  is independent with feeding, dressing, bathing and toileting.  She needs  some assistance with meal prep and high-level household tasks.   REVIEW OF SYSTEMS:  Positive for spasms, tingling, dizziness,  depression, anxiety.  Denies suicidal ideation.  Also positive for poor  appetite and intermittent nausea and shortness of breath intermittently.   Encouraged her to follow up with primary care for some of these other  problems.   Her last shoulder surgery was March 16, 2007, Dr. Larene Beach.   SOCIAL AND FAMILY HISTORY:  Otherwise unchanged.   TODAY EXAM:  Blood pressure is  122/71, pulse 73, respirations 18, 94%  saturated on room air.   She is a well-developed, well-nourished female, who appears to be  comfortable and in no distress.  She is smiling.  Her speech is clear.  Her affect is bright.  She is alert, cooperative and pleasant.  She  transitions from sitting to standing easily.  Gait in the room is  normal.  Balance is good.  She has some limitations with cervical range  of motion, about 10-15 degrees deficit on the right, compared to the  left, with rotation.  She is able to abduct her right shoulder to about  90 degrees with forward flexion, as well as 90 degrees with abduction.  She has some difficulty doing this, but does not display pain behaviors  during this.   Her motor strength is 5/5 in the upper extremities.  Internal and  external rotation was not tested today.  She has intact sensation in the  upper extremities.  Reflexes are 2+ at the biceps, triceps,  brachioradialis, 2+ at the patellar tendons, Achilles tendons are 0.   Tender areas over the right shoulder diffusely.   IMPRESSION:  1. Chronic cervicalgia.  2. Status post shoulder surgery times two, first surgery September 12, 2006, Dr. Ardelle Park, and second surgery March 16, 2007, Dr. Larene Beach.   She is status post cervical decompression, Dr. Lovell Sheehan, in 2007.   PLAN:  Overall, her pain complaints are significantly improved, after  this last surgery, down to a 6 on a scale of 10.  She appears much more  comfortable.  She is engaging in some activities of daily living again  and participating in a physical therapy program.  Apparently Dr. Ardelle Park  is weaning her down from her Oxycodone.  There is a plan to get her off  it, apparently.  We will continue her on Topamax.  I will see her back  in two months.           ______________________________  Brantley Stage, M.D.     DMK/MedQ  D:  04/14/2007 11:56:46  T:  04/15/2007 11:29:17  Job #:  [id number]

## 2010-11-03 NOTE — Assessment & Plan Note (Signed)
[name] is a 50 year old married woman, who is accompanied by her  husband this morning to our [facility].  She has  been followed in this clinic for chronic neck and shoulder pain.  She  was last seen on June 28, 2008.   In the interim, she has seen Dr. Tressie Stalker, who has ordered a CT  myelogram.   Amber Harrell states that Dr. Lovell Sheehan called her last month to let her know  that she does not have a surgical problem at this time.   Ms. Bump continues to complain of left shoulder and arm pain, which  radiates from her hands to her left cervical region.   She states her pain is constant in nature, worse with movement.  She  reports some numbness and tingling in an ulnar distribution.  Average  pain is about a 9 on a scale of 10.  Pain is described as sharp,  burning, dull, tingling, aching in nature.  She is wearing a left wrist  splint.   Pain improves a little bit with heat and medications.   On July 29, 2008, she received #120 hydrocodone tablets.  She has  used #19 tablets since that time.   She does continue to use the Topamax and occasional trazodone at night.   She reports fair relief with medications.   She is independent with driving and can climb stairs.  She is  independent with her self-care except for dressing as she needs a little  assistance with.   Denies problems controlling bowel or bladder.  Admits to some numbness  and tingling in the left upper extremity.  Admits to depression and  anxiety, but denies suicidal ideation.  She does have some problems with  irritable bowel syndrome and migraine headaches and she was recently  placed on Adderall by Dr. Ardelle Park 5 mg in the morning.   No other changes in social or family history since last visit.   Medications from this clinic include:  1. Norco 5/325 up to 4 times a day.  She has used #18 pills since      July 29, 2008.  2. Topamax 25 mg 2 tablets p.o. b.i.d.   She does not  take Celebrex.  She occasionally takes trazodone half of  the 50 mg tablet about 4 or 5 times a month, and she is currently now  taking Flexeril.   On exam, her blood pressure is 124/61, pulse 65, respiration 18, and  100% saturation on room air.  She is well developed, well nourished  woman, who does not appear in any distress.  She is smiling throughout  the course of our interview and appears comfortable.   She is oriented x3.  Speech is clear.  Her affect is bright.  She is  alert, cooperative, and pleasant.  Follows commands without difficulty  and answers questions appropriately.   Her cranial nerves and coordination are grossly intact.  Her sensation,  she reports some increased sensitivity to pinprick some on the left in  her left thumb and slightly decreased sensation to light touch in the  index and middle finger on the left.   She is unable to give me full effort when manual muscle testing of the  left upper extremity secondary to increased pain.  When she exerts,  overall her strength in the left upper extremities is at least in the  4+/5 range.   She has mild limitations in rotation right and left with  flexion/extension with respect to her cervical spine.  She has multiple  areas of tenderness to palpation throughout the cervical paraspinal  muscles, the left scapular, the left parascapular muscles and into the  biceps, triceps, forearm flexors and extensors of the left upper  extremity.   Her gait is otherwise normal, non-antalgic.  Tandem gait and Romberg  test are all performed adequately.  She has limitations in full shoulder  range of motion on the left, is able to abduct to approximately 90  degrees.  She has also limited internal/external rotation.   IMPRESSION:  1. Continued left upper extremity pain.  2. History of cervicalgia, status post cervical decompression by Dr.      Lovell Sheehan, C5-6, May 14, 2006.  3. History of right shoulder surgery.  4.  History of migraines, history of anxiety, and history of      depression.   PLAN:  The patient does not need refill on any of her medications at  this time.  We would like to get her set up for some elective diagnostic  studies.  We will see her back in about a month.  She has been taking  her medications as prescribed.  She has not exhibited any aberrant  behavior with narcotic use and reports overall fair relief with the use  of current medications prescribed through this clinic.  She is indicated  that there are no adverse reactions from any of these medications.  We  will see her back in a month.           ______________________________  Brantley Stage, M.D.     DMK/MedQ  D:  08/21/2008 12:38:54  T:  08/22/2008 02:08:01  Job #:  [id number]

## 2010-11-03 NOTE — Op Note (Signed)
Amber Harrell, Amber NO.:  Harrell   MEDICAL RECORD NO.:  [id number]          PATIENT TYPE:  AMB   LOCATION:  [facility]                          FACILITY:  [facility]   PHYSICIAN:  Loreta Ave, M.D. DATE OF BIRTH:  January 22, 1961   DATE OF PROCEDURE:  03/16/2007  DATE OF DISCHARGE:                               OPERATIVE REPORT   PREOPERATIVE DIAGNOSES:  Right shoulder marked adhesive capsulitis after  previous arthroscopic intervention by a different physical.  Distal  clavicle osteolysis.  Residual impingement.   POSTOPERATIVE DIAGNOSES:  Right shoulder marked adhesive capsulitis  after previous arthroscopic intervention by a different physical.  Distal clavicle osteolysis.  Residual impingement.  Profound adhesive  capsulitis.   PROCEDURE:  Right shoulder exam and manipulation under anesthesia.  Arthroscopy with debridement of intra- and extra-articular adhesions.  Revision acromioplasty, re-release CA ligament.  Excision distal  clavicle.   SURGEON:  Loreta Ave, M.D.   ASSISTANT:  Zonia Kief, P.A.   ANESTHESIA:  General.   BLOOD LOSS:  Minimal.   SPECIMENS:  None.   CULTURES:  None.   COMPLICATIONS:  None.   DRESSING:  Soft compressive with sling.   PROCEDURE:  The patient was brought to the operating room and after  adequate anesthesia had been obtained, the right shoulder was examined.  Virtually no motion.  Perhaps 30 degrees of abduction and forward  flexion and no rotation.  Manipulated with obvious breaking of  adhesions.  I got one large tearing of the capsule that allowed me to  achieve pretty much full motion at completion.  Maintained stable  shoulder.  No adverse occurrence.  Placed in a beach-chair position on a  shoulder positioner, prepped and draped in the usual sterile fashion.  Three portals, anterior, posterior and lateral.  Shoulder entered with  blunt obturator.  Arthroscope introduced.  Shoulder distended and  inspected.   Residual synovitis and intra-articular adhesions all  debrided.  Articular cartilage, capsuloligamentous structures, labrum,  undersurface, cuff, biceps tendon and biceps anchor all intact.  Obvious  tearing of the inferior capsule from manipulation.  After the interior  of the shoulder was cleaned out, cannula redirected subacromially.  Marked adhesions and reactive bursitis all debrided.  Some persistent  anterior impingement at the front of the acromion.  Cuff debrided.  Bursa resected.  Acromioplasty to a type-I acromion with shaver and high-  speed bur.  Distal clavicle grade 3 and 4 changes of osteolysis.  Lateral 1 cm resected.  At completion, adequacy of  decompression, clavicle excision, cuff debridement confirmed.  Maintained nice full motion.  Instruments and fluid removed.  Shoulder  injected with Marcaine.  Portals closed with nylon.  Sterile compressive  dressing applied.  Sling applied.  Anesthesia reversed.  Brought to the  recovery room.  Tolerated surgery well.  No complications.      Loreta Ave, M.D.  Electronically Signed     DFM/MEDQ  D:  03/16/2007  T:  03/17/2007  Job:  [id number]

## 2010-11-03 NOTE — Assessment & Plan Note (Signed)
HISTORY:  Amber Harrell is a married 50 year old female who is followed in  our pain and rehabilitative clinic for chronic neck pain, shoulder pain  and migraines.  She is back in today for a brief recheck.  She states  that her primary care physician, Dr. Ardelle Harrell has decreased her OxyContin  from 20 mg b.i.d. down to 10 mg b.i.d. within the last 7-10 days.  Since  the reduction in the OxyContin, she has noticed worsening of her pain.  She has been feeling generalized myalgias over the last several days  now.  She states her pain has been dull and aching, rather constant in  nature.  Predominantly in the right shoulder and forearm, but she does  have achiness in other muscles as well over the last several days.   Pain is worse with inactivity.  It improves with medications.  She gets  good relief with medications she is on.  She states that she has had  some problems with constipation and has been to the emergency room, and  was placed on Colace, Amitiza as well as MiraLax, and has had no further  problems with her bowels since then.   She is independent with her self-care and household activities.  Denies  problems controlling bowel or bladder.  Denies suicidal ideation, but  admits to some depression and anxiety.   REVIEW OF SYSTEMS:  Otherwise negative other than what I have previously  mentioned.   Past medical history, social/family history are unchanged since last  visit.   MEDICATIONS:  Provided outside this clinic include:  1. Xopenex.  2. Protonix.  3. Nasonex.  4. Astelin.  5. Asmanex.  6. Allegra.  7. Rozerem.  8. Amitiza.  9. Voltaren gel samples.   MEDICATIONS:  From this clinic include Topamax 25 mg 1 p.o. q.8 h.   PHYSICAL EXAMINATION:  VITAL SIGNS:  Blood pressure 122/50, pulse 55,  respirations 18, 98% saturated on room air.  GENERAL:  She is a well-developed, well-nourished female who does not  appear in any distress.  She is oriented x3.  She is alert.  She  is  cooperative, pleasant.  She follows commands without difficulty.  EXTREMITIES:  Transitioning from sitting to standing is done with ease.  Gait in the room is normal.  Tandem gait and Romberg test are performed  adequately.  She has fairly well preserved range of motion in her neck  and without pain complaints during range of motion.  Her right shoulder  is mildly limited in abduction.  Her reflexes are symmetric and intact.  Her motor strength is 5/5 without focal deficit in the upper and lower  extremities.  No abnormal tone is noted.  No tremors are noted.  Sensation is intact.  Reflexes are symmetric and intact in the upper and  lower extremities without side-side differences.  Coordination is  grossly intact.   IMPRESSION:  1. Generalized myalgias status post reduction in narcotics per Dr.      Ardelle Harrell.  2. Insomnia.  3. Chronic right shoulder pain status post surgery x2 with fairly well      preserved range of motion.  4. Cervicalgia.  5. History of migraines.   PLAN:  1. Continue Topamax.  She does not need a refill on this today.  2. We will give her a prescription of some Celebrex today 200 mg 1      p.o. daily p.r.n., number 30.  I anticipate she will only be on  this maybe 1 month or 1-1/2 months.  3. We will also give her a prescription for Trazodone to help her      sleep at night 50 mg one-half tablet p.o. q.h.s. p.r.n. insomnia,      number 15, no refills.  4. I will see her back in a month.           ______________________________  Brantley Stage, M.D.     DMK/MedQ  D:  10/11/2007 13:22:42  T:  10/11/2007 13:52:43  Job #:  [id number]

## 2010-11-03 NOTE — Assessment & Plan Note (Signed)
Amber Harrell is a 50 year old married woman who is followed in our  [facility] for chronic neck and shoulder pain.  She  is status post right shoulder surgery x2 in the past and has also had a  cervical decompression by Dr. Lovell Sheehan.   She is back in today and now has had approximately 6-8 week history of  some left lateral upper arm pain which has been persistent for her.   At last month's visit, I asked her to start nonsteroidals as well as use  a soft collar.  She states that she did not use the soft collar because  her soft collar was quite dirty.   She states that she is not feeling well, has lack of energy, saw her  internist this morning and has been placed on Valtrex for the next 7  days.   Her average pain is about 7 on a scale of 10.  Sleep has been poor  recently.  Pain is worse with moving her neck and her left upper  extremity.  She gets between fair and good relief with current  medications prescribed by our clinic.   MEDICATIONS:  From this clinic include:  1. Celebrex 200 mg daily.  2. Topamax 25 mg twice a day.  3. Trazodone 50 mg at bedtime.   FUNCTIONAL STATUS:  She is independent with self-care.  She walks as far  she wishes.  She is able to drive.  No problems controlling bowel or  bladder.  Admits depression and anxiety, but denies suicidal ideation.  Currently, she has some diarrhea and has a history of irritable bowel  syndrome.   Otherwise, no other changes in past medical, social, or family history.   PHYSICAL EXAMINATION:  VITAL SIGNS:  Blood pressure is 120/63, pulse 71,  respirations 18, and 99% saturated on room air.  GENERAL:  She is a well-developed, well-nourished female who does appear  somewhat tired this morning.  NEUROLOGIC:  She is oriented x3.  Speech is clear.  Affect is bright.  She is alert, cooperative, and pleasant; follows commands without any  difficulty.  Cranial nerves and coordination are grossly intact.   Reflexes are  symmetric and intact in the upper and lower extremities.  No abnormal  tone is noted.  No clonus is noted.  Sensory exam is intact to light touch in bilateral upper extremities.  Romberg test and tandem gait are performed adequately.  She has a normal  gait.  MUSCULOSKELETAL:  Slightly decreased range of motion with rotation to  the right, lacks about 10 degrees of rotation to the right.  She reports  some pain in the left side of her neck when she does this.  Flexion and  extension are fairly well preserved.  She has 90 degrees of abduction  bilaterally, 90 degrees of external rotation bilaterally, and 30 degrees  of internal rotation bilaterally.  Complains of pain in the posterior  neck with movement of the left shoulder today.  Multiple areas of  tenderness to palpation throughout the cervical paraspinal muscles and  parascapular muscles on the left today.   IMPRESSION:  1. Cervicalgia status post cervical decompression by Dr. Lovell Sheehan.  2. Chronic right shoulder pain status post surgery x2.  3. History of migraine.  4. Insomnia.  5. History of anxiety.  6. New left upper lateral arm pain times 6 to 8 weeks now.   PLAN:  1. Prescription for a soft cervical collar.  2. Cervical flexion  and extension radiographs.  3. Increase Topamax 25 mg b.i.d. to 25 mg t.i.d.  4. Flexeril 5 mg b.i.d. p.r.n.  5. Consider referral back to Dr. Lovell Sheehan should she not improve with      conservative measures.  We will see her back in a month.           ______________________________  Amber Harrell, M.D.     DMK/MedQ  D:  04/05/2008 12:44:30  T:  04/06/2008 01:00:02  Job #:  [id number]

## 2010-11-03 NOTE — Assessment & Plan Note (Signed)
Amber Harrell is a 50 year old married female who is accompanied by her  husband this morning to our pain and rehabilitative clinic.   She is back in today and continues to have complaints of right shoulder  pain.   She is now status post 2 surgeries of the right shoulder dating back  September 12, 2006 by Dr. Tia Masker and Dr. Eulah Pont, March 16, 2007.  She  also is status post cervical decompression by Dr. Lovell Sheehan back in 2007.   She has been through some rehabilitation.  She states she is currently  obtaining opioid narcotic medications through her primary care doctor,  Dr. Ardelle Park.  He has her currently on OxyContin 20 mg every 12 hours.   She states her average pain is about a 6 to a 7 on a scale of 10.  Pain  is described as aching, tingling, dull, and intermittent in the right  shoulder region, interfering significantly with general activity as well  as enjoyment of life.  Pain is worse with walking, bending, and activity  and standing, and improves with medication.  Currently getting good  relief with the current meds that she is on.   MEDICATIONS PROVIDED BY OUR CLINIC:  Topamax 25 mg 1 p.o. q.12 h.  She  is no longer on Ultracet, Lortab, or Celebrex through our clinic.   She does have an intolerance to [facility], makes her too sleepy, and she  also has been on ULTRAM which has caused her some hives.   FUNCTIONAL STATUS:  The patient is independent with her self care.  She  is able to walk 20 minutes at a time.  She is able to climb stairs.  She  currently does drive.  She helps out with some household tasks such as  some light cleaning, laundry, and grocery shopping.  They eat out most  of the time, however.  Denies problems controlling bowel or bladder.  Admits to some tingling in the upper extremities which improves with the  use of the Topamax.   She admits to anxiety and depression, denies suicidal ideation.   REVIEW OF SYSTEMS:  Otherwise negative.   PAST  MEDICAL/SOCIAL/FAMILY HISTORY:  Otherwise unchanged from last  visit.   PHYSICAL EXAMINATION:  Blood pressure is 149/89, pulse 76, respirations  18, 98% saturated on room air.  She is a well-developed, well-nourished female who does not appear in  any distress.  She is oriented x3, her speech is clear, her affect is  bright.  She is alert, cooperative and pleasant and she follows commands  easily.  Transitioning from sitting to standing is done with ease.  Her gait in  the room is stable, normal gait.  Tandem gait and Romberg's test are  performed adequately.  She has some mild decreased range of motion with  rotation to the right, lacking about 5 degrees on the right compared to  left rotation.  Flexion and extension are just mildly limited.  She has  fairly good shoulder range of motion, both right and left, with  abduction.  Her coordination is grossly intact as well.  Reflexes are symmetric and intact in the upper and lower extremities.  No focal weakness is appreciated.  No clonus and no abnormal tone is  noted.  Sensation is intact to light touch in both upper extremities.  She has multiple areas of tenderness throughout the trapezius on the  right as well as left, and throughout the periscapular muscles,  including supra and infraspinatus muscle bellies.  IMPRESSION:  1. Chronic right shoulder pain, status post shoulder surgery x2 with      fairly well-preserved range of motion.  2. Chronic cervicalgia.  3. Status post shoulder surgery x2, September 12, 2006, Dr. Tia Masker, and      March 16, 2007, Dr. Eulah Pont.  4. Status post cervical decompression, Dr. Lovell Sheehan, 2007.   PLAN:  She is currently anticipating  having her opioid narcotic  refilled by Dr. Ardelle Park.  Our clinic currently is not providing her  narcotic pain medication at this time.  She is requesting a refill of  her Topamax.  She states it does help a lot with the abnormal sensations  she feels intermittently in both  right and left upper extremities, and  helps to take care of the pain, especially in the left as well as right  arm.   Also recommend she continue to use a soft collar on a p.r.n. basis as  well as her TENS unit.  She is given Topamax prescription 25 mg 1 p.o.  q.12 h. #60 with 2 refills.  She has not experienced any side effects  from this medication.  We did discuss possibly adding Elavil.  She would  like to hold off at this point.  May consider it, however, in the  future.  She has trialed in the past and has been adamant not to be on  any kind of antidepressants.  However, we discussed it may help with  treatment of neuropathic pain.  She may consider this in the next visit  or so.           ______________________________  Brantley Stage, M.D.     DMK/MedQ  D:  07/19/2007 11:04:33  T:  07/19/2007 13:32:34  Job #:  [id number]   cc:   Donnel Saxon  Fax: [phone]

## 2010-11-03 NOTE — Assessment & Plan Note (Signed)
Amber Harrell is a 50 year old married woman, who has been followed in our  Pain and Rehabilitative Clinic for chronic neck pain as well as shoulder  pain and intermittent low back pain.   She is status post cervical decompression at C5-6 by Dr. Lovell Harrell on  May 14, 2006.   She has a history of migraines, anxiety, and depression.   She recently underwent left shoulder surgery by Dr. Eulah Harrell on Oct 23, 2008.  He is currently prescribing Percocet for her and she has been  participating in physical therapy program prescribed by him as well.   She is back in today for a brief recheck.  She does not need any  medications at this time.  She continues to use Topamax 25 mg 2 tablets  twice a day.  She does not need a refill until July 2010 and she takes  her trazodone intermittently and she uses Flexeril intermittently as  well p.r.n.  She does not need to refill on this until June, the next  month.   She states her pain is severe especially with therapy 10 on a scale of  10.  Pain is described as constant in the left shoulder region, sharp,  stabbing, tingling, aching in nature, worse with activities.   Pain is fairly constant, worse with activities which require upper  extremity use.  Pain improves with rest, heat, therapy, and medications.   She has fair relief with current meds.   FUNCTIONAL STATUS:  She is able to walk about 50 minutes at a time.  She  can climb stairs.  She does not drive at this time.  She is in a left  shoulder sling.   She requires some assistance with dressing.  Currently feeding, bathing,  and toileting are independent.   Denies problems controlling bowel or bladder.  Admits to some depression  and anxiety.  Denies suicidal ideation.   Admits occasional constipation and nausea and not a major problem for  her at this time.   She has appointment with Dr. Eulah Harrell on November 26, 2008, just had one with  him on Oct 29, 2008, and he is currently managing her pain  medications  postoperatively.   No changes otherwise in social or family history.   PHYSICAL EXAMINATION:  VITAL SIGNS:  Blood pressure 117/70, pulse 61,  respiration 18, and 99% saturated on room air.  GENERAL:  She is a well-developed, well-nourished female, who does not  appear in any distress.  NEUROLOGIC:  She is oriented x3.  Speech is clear.  Affect is bright.  She is alert, cooperative, pleasant.  Follows commands without  difficulty answers questions appropriately.  She moves her neck freely  during our conversation and she is smiling.  Cranial nerves are grossly  intact.  Coordination is intact.  Reflexes are 1+ at biceps, triceps,  brachioradialis on the right and 2+ at patellar tendons bilaterally, 1+  at the Achilles tendons bilaterally.  No abnormal tone or clonus is  appreciated.  No tremors are appreciated.  Motor strength in the right  upper extremities in the 5/5 range.  Lower extremities are 5/5 without  focal deficit.  MUSCULOSKELETAL:  Minimal limitations noted in cervical range of motion  today.  She has approximately 120 degrees of abduction of the right  shoulder.  External rotation is approximately 80 degrees and right  shoulder internal rotation is at 25-30 degrees.  Left upper extremity  was not evaluated today.  She is able to transition  from sitting to  standing easily.  Gait in the room is normal.  Tandem gait and Romberg  test all performed adequately.   IMPRESSION:  1. Chronic left upper extremity pain, status post left shoulder      arthroscopic surgery by Dr. Eulah Harrell on Oct 23, 2008.  2. Status post cervical decompression by Dr. Lovell Harrell at C5-6 on      May 14, 2006, history of left shoulder pain with a diminished      range of motion.  3. History of migraines.  4. History of anxiety and depression.   PLAN:  The patient is currently following up postoperatively with Dr.  Eulah Harrell.  He is prescribing her narcotic pain medication as well as her   physical therapy at this time.  She is managed in this clinic for her  migraine headaches.  She takes Topamax 25 mg twice a day.  She is doing  well on this medication.  She does not need a refill until July.  Norco  has not been prescribed since she has had recent surgery as Dr. Eulah Harrell  is taking this over currently.  She has taken p.r.n. Celebrex.  She is  not currently taking this at this time and she is taking p.r.n. Flexeril  5 mg not more than twice a day.   She has been stable on above medications prescribed through our clinic.  We encouraged her to maintain contact with Dr. Eulah Harrell and follow postop  instructions.  We will see her back in 2 months.           ______________________________  Amber Harrell, M.D.     DMK/MedQ  D:  11/01/2008 11:15:42  T:  11/02/2008 01:06:42  Job #:  [id number]   cc:   Amber Harrell, M.D.  Fax: [phone]

## 2010-11-03 NOTE — Assessment & Plan Note (Signed)
Amber Harrell is a 50 year old female followed in our pain and  rehabilitative clinic for cervicalgia and right shoulder pain, as well  as intermittent low back pain.   She is back in today for a refill of her trazodone.   She has overall been doing well.  Her overall function is improved.  She  is going to church and helping her grown children out as well.  Her  average pain is about 9 on a scale of 10 predominantly located in the  right shoulder region, low back and cervical region.   The pain is typically worse with activity, improved with rest and  medication.  The pain is described as constant, tingling, aching, sharp,  dull in nature.  She is up intermittently throughout the day now.  She  is able to climb stairs and drive.   She is independent with her self-care.  She has had some problems with  constipation but denies problems controlling her bowel or her bladder.  Admits to some depression, anxiety.  Denies suicidal ideation.  Reports  that the trazodone has significantly helped her, with improvement of  sleeping at night.  She is requesting a refill of this.   REVIEW OF SYSTEMS:  Otherwise noncontributory.  She does continue to  have some intermittent bowel problems related to constipation.   MEDICATIONS:  She is on multiple medications for this including Colace,  Amitiza, as well as MiraLax.   Medications provided by this clinic include trazodone 50 mg 1 p.o.  q.h.s.  She is also using tramadol, which is through another [facility] Fialkowski at  this time.  She takes 50 mg q.i.d.   PHYSICAL EXAMINATION:  Today her blood pressure is 127/70, pulse 76,  respirations 20, 97% saturation on room air.  She is a well-developed, well-nourished female who does not appear in  any distress.  She is well groomed and she is oriented x3.  Her speech  is clear.  Her affect is bright.  She is alert, cooperative and pleasant  today.  Transitioning from sitting to standing is done easily.  She has a  high-  heeled shoe on today, about 1-1/2 inches to 2 inches tall.  She has good  balance while she walks in them.  Gait is essentially normal.  She has  limitations in cervical range of motion, especially with rotation to the  right.  The right shoulder also has limitations.  She has essentially no  internal rotation on the right.  External rotation of the right shoulder  is about 45 degrees.  Internal rotation on the left is about 80 degrees  and external rotation is about 75 as well.  Lumbar motion is also limited in all planes.  Her reflexes otherwise are symmetric and intact in the upper and lower  extremities without focal deficits.  Sensory exam and motor strength are  all within normal limits as well.  Her tone is normal in the lower extremities.  No clonus is noted.   IMPRESSION:  1. Cervicalgia.  2. Chronic right shoulder pain, status post surgery x2, with some      deficits in range of motion.  3. History of migraines.  4. Insomnia, overall improved with the use of trazodone.   PLAN:  She does not need a refill on her Topamax today.  Will continue  that.  Will give her a new prescription for trazodone 50 mg 1 p.o.  q.h.s.  Will anticipate refilling her tramadol next month.  Will  consider restarting her back on Ultracet 2 tablets p.o. t.i.d.  She had  been taking that previously.  She is currently on tramadol 50 mg 4 times  a day.  She states she found that the combination of tramadol,  acetaminophen was a bit more helpful in managing her shoulder pain.  I  will see her back in a month.  She has been stable on the above  medications.  She takes them as  prescribed and she is able to maintain a relatively functional  lifestyle.  I have encouraged her to continue some range of motion  exercises for the right shoulder again.           ______________________________  Brantley Stage, M.D.     DMK/MedQ  D:  11/08/2007 13:07:15  T:  11/08/2007 14:06:19  Job #:   [id number]

## 2010-11-03 NOTE — Assessment & Plan Note (Signed)
Amber Harrell is a 50 year old married woman who is accompanied by her  husband.  She is back in today for a recheck.  She has been having  complaints of bilateral arm pain and increased neck pain over the last  couple of months, and seems to be getting worse in the last couple of  weeks now.  She has been wearing a cervical collar.  She has been on  some nonsteroidal antiinflammatory medications, and recently received  some Soma from her primary care doctor.  Overall, she continues to be  quite miserable with neck and arm pain.  She describes her pain as about  a 9 on a scale of 10, predominantly located in the posterior cervical  region, radiating down both upper extremities to the hands.  Pain is  fairly constant, intermittent, tingling, aching, dull in nature,  interfering significantly with activity level.  Sleep has been poor.  Pain is worse with any kind of activity, improves with heat and  medications.  She is getting fair relief with current meds.   She has limited her walking right now.  She is fairly sedentary at home.  She is able to climb stairs.  She is still able to drive.  She needs a  little help with upper extremity dressing.  She is independent with  feeding, bathing, and toileting.  Her husband is doing more of the meal  prep and household tasks at this time.  Denies problems controlling  bowel or bladder.  Admits to some depression and anxiety.  Denies  suicidal ideation.   REVIEW OF SYSTEMS:  Otherwise, negative.   PAST MEDICAL HISTORY:  Unremarkable.   SOCIAL HISTORY:  Unchanged.   FAMILY HISTORY:  Unchanged.   Radiographs done on April 17, 2008, cervical radiographs show,  anterior fusion at C5-6 appeared stable, degenerative changes noted at  C6-7.  Normal range of motion through flexion and extension without  obvious instability.   PHYSICAL EXAMINATION:  VITAL SIGNS:  Blood pressure is 129/78, pulse 84,  respirations 18, and 99% saturate on room air.  GENERAL:  Ms. Deremer is a well-developed and well-nourished woman who  appears in mild distress.  She is upset about her level of pain and the  length of time she has been having problems with her neck and arms.  NEUROLOGIC:  She is oriented x3.  Speech is clear.  Affect is bright.  She is alert, cooperative, and pleasant.  She follows commands without  problems.  Cranial nerves are grossly intact.  Coordination is intact.  Reflexes are 2+ in the upper extremities and 1+ in the lower  extremities.  There is no abnormal tone.  No clonus is noted.  Motor  strength is difficult to assess.  She has increased pain in the neck and  arms with attempting to give me full strength.  Strength is generally in  the 4+/5 range at deltoids, biceps, triceps, brachioradialis, finger  flexors, and intrinsics.  Lower extremity strength is in the 5/5 range.  She is complaining of pain, in near tears during manual muscle testing.   Her neck range of motion is significantly limited, not more than 10-15  degrees of rotation, right or left, and limitations are significant also  in the forward flexion, extension, and lateral flexion today as well.  Shoulder range of motion is limited to approximately 90 degrees of  abduction.  She complains of pain throughout the exam today.   Her gait is stable.  Tandem gait and  Romberg test are also performed  adequately.  Tone is normal in upper and lower extremities.   Toes are downgoing.   IMPRESSION:  1. Cervicalgia status post cervical decompression by Dr. Lovell Sheehan at C5-      6.  2. 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  3. History of migraine.  4. Insomnia.  5. History of anxiety.  6. New bilateral upper extremity pain, 8-10 weeks now.   PLAN:  Trial Medrol Dosepak.  I reviewed the risks and benefits of this  medications with her.  She would like to proceed with using it.  I have  told her to discontinue any  nonsteroidals she is taking including  Celebrex and anything over the counter such as Motrin or naproxen.   I recommended continuing use of cervical collar.  Continue Topamax 25 mg  t.i.d.  Obtain MRI of the cervical spine to assess C6-7 level more  fully.  I will also start her on some hydrocodone 5/325 up to 4 times a  day, #32.  I will see her back in 1 week for reevaluation.  I have asked  her to make an appointment to follow up with Dr. Lovell Sheehan as well.           ______________________________  Brantley Stage, M.D.     DMK/MedQ  D:  05/06/2008 12:25:55  T:  05/07/2008 06:25:49  Job #:  [id number]

## 2010-11-03 NOTE — Assessment & Plan Note (Signed)
[name] is a 50 year old married female who is being seen in our pain  and rehabilitative clinic for right upper extremity pain.   She is status post a cervical decompression at C5-C6 for left upper  extremity pain.  She did well postoperatively, but then developed some  right upper extremity pain.  She eventually underwent acromioplasty with  Dr. Synthia Innocent on September 12, 2006.  She is about 12-1/2 weeks postoperative  today.   She continues to complain bitterly of right upper extremity pain.  Sitting in the office at rest she states her pain is about a 4 or a 5 on  a scale of 10.  It can go up to a 10 if she is active.   She states she has discontinued physical therapy several weeks ago  because of increased pain.  She did call Dr. Lovell Sheehan' office and was  given a prescription of 50 hydrocodone 10/500 on May 28 with 1 refill.   She brings in an empty bottle today, and she has 1 more refill of 50  pills of the 10/500 tablets.   She reports her sleep has been typically poor.  She is getting fair to  good relief with the meds when she takes them.   She is otherwise independent with her feeding, toileting, bathing, needs  some assistance with dressing.  Occasionally needs assistance with meal  prep, household duties, shopping.   REVIEW OF SYSTEMS:  Noted and attached to the chart today.   PAST MEDICAL/SOCIAL/FAMILY HISTORY:  Noncontributory.   Pain medications are currently not being provided by this clinic as she  has been in the perioperative period with Dr. Faye Ramsay office.  This  will need to be clarified.   PHYSICAL EXAMINATION:  Blood pressure is 124/80, pulse 79, respiration  16, 100% saturated on room air.  She is a well-developed, well-nourished female who does not appear in  any distress during our interview.  She is oriented x3.  Her speech is  clear, her affect is bright, alert, cooperative and pleasant.  She transitions from sitting to standing without any difficulty.   Her  gait in the room is normal.  Tandem gait, Romberg's test were performed  adequately.  She does stand with her head slightly tilted to the left and slightly  rotated to the left as well.  She has limitations in abduction on the right to between 45 and 50  degrees actively.  She states she does not have pain during this arc of  active motion.  She has limitations in the cervical range of motion, especially rotation  to the left is limited more than the rotation to the right, both areas  limited slightly.  Extension increases pain into her right suprascapular  region.  She does have tenderness to palpation over the suprascapular and  infrascapular regions today as well.  These areas are exclusively  tender.  Reflexes in the upper and lower extremity are in the 2+ range.  They are  symmetric, intact, no abnormal tone is noted, no clonus is noted.  Sensory exam was evaluated today, light touch and pinprick.  No sensory  deficits were appreciated in dermatomes 5-T1.  Motor strength on the left is in the 5/5 range, in the right motor  strength was not tested with shoulder abduction or flexion or extension.  Her bicep and tricep were evaluated and were in the 4+/5 range.  She  reports some discomfort in the posterior scapular area with manual  muscle testing.  Distally she is intact with respect to strength.   IMPRESSION:  1. Status post right shoulder surgery, acromioplasty, done by Dr.      Synthia Innocent, September 12, 2006.  2. Status post cervical decompression, Dr. Lovell Sheehan, Fall of 2007.  3. Continued right upper extremity pain, etiology may be      multifactorial in nature.  MRI, which was ordered by Dr. Ardelle Park,      done on December 04, 2006, showed no significant change in prior fusion      at C5-C6, mild disk bulging was noted at C6-C7, eccentric to the      left with minimal left foraminal narrowing without significant      change.  She also had right shoulder MRI which showed subacromial       and subdeltoid bursitis as well as post surgical changes.  These      studies were read by Dr. Alfredo Batty and Dr. Jena Gauss respectively.   PLAN:  Will trial her on Topamax starting at 25 mg 1 p.o. nightly for 5  days, then b.i.d. for 5 days, then up to t.i.d. if she tolerates that.  She has been on Neurontin before and tolerated that well; however, she  has concerns about weight gain and would prefer not to use Neurontin if  it may cause her some weight gain again.   She is using Flexeril 5 mg 2-3 times a day as well, and has a  prescription for 10/500 Vicodin #50 per Dr. Lovell Sheehan.   Discussion with Dr. Faye Ramsay PA, Cletus Gash, today while the patient was  in clinic, given her increased pain they would like to see her this  afternoon as a work in.  The patient did verbalize she had concerns of  an infection.   Discussion with Cletus Gash that after she finishes her prescription with  Dr. Lovell Sheehan of her Vicodin, our clinic will again take over prescribing  her pain medications since she is now about 12 weeks postop.  Will see  her back in a month.           ______________________________  Brantley Stage, M.D.     DMK/MedQ  D:  12/08/2006 10:50:01  T:  12/08/2006 12:55:33  Job #:  [id number]   cc:   Synthia Innocent, MD   Cristi Loron, M.D.  Fax: [phone]

## 2010-11-03 NOTE — Assessment & Plan Note (Signed)
Ms. Solarz is a 50 year old married woman who has been followed in our  pain and rehabilitative clinic   Dictation ended at this point.           ______________________________  Brantley Stage, M.D.     DMK/MedQ  D:  09/27/2008 13:44:43  T:  09/28/2008 10:58:44  Job #:  [id number]

## 2010-11-03 NOTE — Assessment & Plan Note (Signed)
Ms. Dacy is a 50 year old married female who is followed in our pain  and rehabilitative clinic predominantly for cervicalgia, intermittent  migraines, and shoulder pain.   She is back in today and has a new complaint of some mild low back pain,  which she acquired after doing some cleaning.  She states that her  average pain is between 5-6 on a scale of 10, interfering significantly  with activity currently.  Sleep tends to be poor.  The pain is worse  with walking, bending, standing.  Improves with medication.   She is able to walk about 15 minutes at a time.  She is able to climb  stairs and drive.  She is independent with her ADLs.  She admits to some  depression and anxiety.  Denies suicidal ideation.  She has also had  some recent problems with constipation.  Her primary care doctor has put  her on some medication for this as well.  She states that Mirapex was  started about two weeks ago for lower extremity symptoms.   Medications provided by our clinic include Topamax 25 mg 1 p.o. q.12h.   PHYSICAL EXAMINATION:  Blood pressure 128/71, pulse 68, respirations 18,  98% saturated on room air.  She is a well-developed and well-nourished female who does not appear in  any distress.  She is oriented x3.  Speech is clear.  Affect is bright.  She is alert, cooperative, and pleasant.  Follows commands without  difficulty.  Transitioning from sitting to standing is done with ease.  She has good  balance.  Tandem gait and Romberg's test are performed adequately.  Cranial nerves are grossly intact.  She has 2+ reflexes at biceps,  triceps, brachial radialis, 2+ patellar and Achilles tendons.  No  abnormal tone is noted.  No clonus is noted.  No sensory deficits are  appreciated.  Motor strength is 5/5 without focal deficit.  She has some  tenderness to palpation in the cervical paraspinal musculature and in  the low back.  She does have some mild limitation in lumbar motion,  especially  with forward flexion.   IMPRESSION:  1. New mild low back pain which occurred after doing some light      cleaning activities.  No new neurologic deficits are appreciated.  2. Chronic right shoulder pain, status post surgeries x2, with fairly      well preserved range of motion.  3. Chest cervicalgia.  4. History of migraine headaches for approximately five years.  Had      been treated in the past with Relpax and Imitrex; however, has not      needed any other medications other than Topamax over the last      several months.  Is doing well on the Topamax with respect to      migraines.  Topamax has also been beneficial in decreasing arm      tingling and dysesthetic-type sensations in the right arm.   PLAN:  Patient will refill her Topamax today, 25 mg 1 p.o. q.8h., #90,  with three refills.  She has not had any problems with side effects from  this medication.  She has done quite well on it.  It is helping her a  great deal with her headaches as well as her upper extremity numbness  and tingling.  We will see her back in a month.           ______________________________  Brantley Stage, M.D.  DMK/MedQ  D:  09/13/2007 15:10:18  T:  09/13/2007 21:14:52  Job #:  [id number]

## 2010-11-03 NOTE — Assessment & Plan Note (Signed)
Amber Harrell is a 50 year old married female who is being seen in our pain  and rehabilitative clinic for chronic neck, shoulder, and arm pain.   She is status post right shoulder acromioplasty 09/12/2006 by Dr. Ardelle Park  and cervical decompression by Dr. Lovell Sheehan in Fall 2007.   She is back in today and complaining bitterly of neck and arm pain,  especially pain into the right bicipital region. She states that her  pain is about 10 on a scale of 10, currently now it is 7. She describes  it as a deep ache, but it becomes sharp with movement.   The pain is fairly constant, worse with inactivity and certain  activities, especially moving the arm. It improves with heat, ice,  medication, andTENS unit. She gets better relief with the current  medications that she is on.   Narcotic pain medications have been prescribed by Dr. Ardelle Park. She  received 90 pills on 01/27/2007, and has 55 15 mg Oxycodone left.   She states that she has completed physical therapy. She is currently  doing a home program which seems to include some range of motion  exercises.   She is able to walk about 20 minutes at a time. She is able to drive.  She admits to depression and anxiety. She denies suicidal ideation. She  does follow with Darl Householder, a psychiatric therapist about every two  weeks.   REVIEW OF SYSTEMS:  Positive for nausea, constipation, poor appetite,  shortness of breath, and she states that she has a history of asthma and  allergies.   PRIMARY CARE PHYSICIAN:  Dr. Rachael Darby.   NEUROSURGEON:  Dr. Lovell Sheehan.   ORTHOPEDIST:  Dr. Ardelle Park.   PSYCHIATRIC THERAPIST:  Darl Householder.   No other changes in past medical, social, or family history since her  last visit.   MEDICATIONS:  Prescribed by this clinic include:  1. Topamax 25 mg 1 twice daily at this time.  2. Celebrex occasionally.   Narcotics are not prescribed through this clinic at this time, since her  orthopedist has taken over that role.   PHYSICAL EXAMINATION:  VITAL SIGNS:  Blood pressure 111/65, pulse 56,  respirations 18, 100% saturated on room air.  She is a well developed, well nourished female who does not appear in  any distress, but she is rubbing her right upper extremity as she speaks  to me. She is oriented x3. Her affect is alert. She is cooperative. She  seems a bit irritable and anxious however. She follows commands without  any problems and her speech is clear. She transitions from sitting to  standing easily in the room. Her gait is normal. Tandem gait, Romberg's  test are performed adequately. She has some limitations in cervical  range of motion in all plains. She has approximately 60 degrees of right  shoulder abduction and almost 90 degrees of right shoulder flexion. Her  reflexes are otherwise symmetric and intact. Her motor strength is good  within her range of motion in the upper extremities. Lower extremity  motor strength is 5/5 with hip flexions, knee extensions, dorsi flexors,  and plantar flexors, EHL. Multiple areas of tenderness are noted  throughout the upper trapezius and shoulder regions.   IMPRESSION:  1. Chronic right neck and shoulder pain.  2. Status post right shoulder surgery, acromioplasty, Dr. Ardelle Park on      September 12, 2006.  3. Status post cervical decompression, Dr. Lovell Sheehan, in 2007.  4. Chronic right upper extremity pain,  most likely is multifactorial      nature.  5. She states that Dr. Ardelle Park is planning to possibly refer her to      another surgeon for another opinion and evaluation. She has not      done this quite yet however. She has an appointment with him today.      We will see her back in two months. I requested that she bring      notes from her orthopedic visits at her next visit. She will give      [place] a call if she requires any refills on her Topamax which she is      taking 25 mg twice daily.           ______________________________  Brantley Stage,  M.D.     DMK/MedQ  D:  02/15/2007 11:32:40  T:  02/16/2007 02:04:26  Job #:  [id number]

## 2010-11-03 NOTE — Assessment & Plan Note (Signed)
Amber Harrell is a 50 year old married female who is followed in our Pain  and Rehabilitative Clinic for chronic neck and right shoulder pain and  occasional right arm pain which is felt to be neuropathic in nature.   She is status post a neck surgery in the past by Dr. Lovell Sheehan and has had  2 right shoulder surgeries as well.   She is back in today requesting refills of her Celebrex, Topamax, and  trazodone.   She states her average pain is about 8 on a scale of 10 today, in the  clinic it is about 6.  Pain is described as dull, aching, and burning,  burning down the right arm occasionally, aching and dull into the right  periscapular region.  She states her pain interferes completely with  activity, however, she states she was recently fishing with her husband  and actually caught more fish than he did.  Her sleep is fair when she  takes the trazodone.  Pain is typically worse in the morning, daytime,  and in the evening.   Pain is worse with bending; improves with medications, soft cervical  collar, and TENS unit.   She reports good relief with current meds which are prescribed through  this clinic.   MEDICATIONS:  From our Pain and Rehabilitative Clinic include:  1. Celebrex on a p.r.n. basis.  2. Topamax 25 mg 3 times a day.  3. Trazodone 50 mg 1 p.o. at bedtime.   Her functional status is as follows.  She is able to walk about 10  minutes at a time.  She climbs stairs.  She is able to drive.  She is  independent with self care and higher level household tasks.  She does  have some trouble with heavier household tasks.  She has been disabled  since 2004.   She admits to depression and anxiety, denies suicidal ideation.   No other changes in past medical, social, or family history since our  last visit except for that she has occasional constipation.   Her medical problems include this list, COPD, mitral valve prolapse,  mild GERD, cervical dysplasia, and migraine  headaches.   PHYSICAL EXAMINATION:  VITAL SIGNS:  Her blood pressure is 127/78, pulse  66, respiration 18, and 99% saturated on room air.  GENERAL:  She is a well-developed, well-nourished female who does not  appear in any distress.  She is oriented x3.  Speech is clear.  Affect  is bright.  She is alert, cooperative, and pleasant.  She follows  commands without difficulty.   She has significant limitation in cervical range of motion in all  planes.  She has about 20 degrees of rotation to the left and about 30  degrees of rotation to the right with respect to her cervical spine.  Extension and flexion are also limited.  She is able to abduct bilateral  shoulders to about 90 degrees, not much more than this bilaterally.   Lumbar motion is mildly limited in all planes.   Her reflexes are slightly brisk overall today.  No clonus is noted.  She  has good strength in the upper and lower extremities without focal  deficits.  Sensation is intact as well.   Tenderness throughout the cervical  parascapular muscles as well as in  to the upper extremity in the area of the deltoid and biceps with gentle  palpation.   IMPRESSION:  1. Cervicalgia status post decompression, Dr. Lovell Sheehan.  2. Chronic right shoulder pain  status post surgery x2.  3. History of migraines.  4. Insomnia.  5. History of anxiety.   PLAN:  We will refill the following medications for her today.  Celebrex  200 mg 1 p.o. daily p.r.n. neck or arm pain #30, 2 refills; Voltaren gel  1% applied 4 g q.i.d. to lower extremities, not more than 32 g per day,  not to be used concomitantly with Celebrex.  We will also refill her  trazodone 50 mg 1 p.o. q.h.s. #30; Topamax 25 mg 1 p.o. t.i.d., #90, 3  refills on both of these medications.   The patient states she started a new medicine yesterday called that  Pristiq, apparently a nurse practitioner started her on this.  I have  advised her that there is possible drug  interaction with her trazodone  with this particular drug.  I asked her to call her PCP regarding this.  I told her if she wanted to continue the Pristiq, I would consider  taking her off the trazodone.  Currently, she has been stable on the  above medications.  She takes her medications as prescribed.  She does  not display any aberrant behavior with their use.  She reports overall  good relief with current meds.  I have given her an activity sheet for  her to monitor her activity level over the next month and asked her to  start doing a walking program at least every other day for 10-15  minutes.  We will see her back in a month.           ______________________________  Brantley Stage, M.D.     DMK/MedQ  D:  02/07/2008 12:08:46  T:  02/08/2008 03:45:50  Job #:  [id number]

## 2010-11-06 NOTE — H&P (Signed)
NAME:  Amber Harrell, Amber A.                          ACCOUNT NO.:  [id number]   MEDICAL RECORD NO.:  [id number]                   PATIENT TYPE:  INP   LOCATION:                                       FACILITY:  [facility]   PHYSICIAN:  Althea Grimmer. Luther Parody, M.D.            DATE OF BIRTH:  01/09/1961   DATE OF ADMISSION:  04/02/2003  DATE OF DISCHARGE:                                HISTORY & PHYSICAL   HOSPITAL LOCATION:  [facility] awaiting admission.   Amber Harrell is a 50 year old female who presented to the office self referred  12 days ago complaining of gaseous distention of her abdomen, particularly  after eating, with vomiting and obstipation.  She has been evaluated by a  gastroenterologist in [place] and placed on Zelnorm.  She is also using  multiple other medications including Colace, Reglan, and MiraLax, and  occasional magnesium citrate.  She has a longstanding history of abdominal  complaints and reports that she does not move her bowels except once every  two weeks unless she takes  laxatives.  Records reveal that in October of  2000, she had a normal small-bowel series, and she reportedly had a negative  colonoscopy last year.  She does report that she has a history of polyps in  the past.  She is status post cholecystectomy for stone disease.  Her  appetite is not decreased, and she has actually been gain weight which she  says is due to all the stool in her abdomen.   She was hospitalized in late September in [place].  Apparently she had  nasogastric suction and laxatives administered, and her abdomen became much  flatter.  As soon as she returned home and started eating, she began having  progressive abdominal swelling and constipation.  She takes Prevacid twice a  day for reflux.  Her abdominal discomfort is generalized with most severe  discomfort in the left upper quadrant where she thinks she feels a lump.  Unless she takes laxatives, her bowel movements have been small  and hard.  She has not had melena or hematochezia, and stool has tested guaiac  negative.  She also reports that she had a hiatal hernia on a prior upper  endoscopy.  At her last evaluation, I sent her for an abdominal x-ray which  showed significant constipation but no other pathology.   She has called the office multiple times and returns today not feeling any  better.  Her weight is up 4 pounds.  She is vomiting continuously in the  office during the interview and examination and complaining of pain in the  left upper quadrant.   PAST MEDICAL HISTORY:  1. Anxiety and panic attacks.  2. Cholecystectomy.   CURRENT MEDICATIONS:  List is extensive and probably resulting in some of  her GI distress.  It includes:  1. Magnesium citrate.  2. Symbyax 6/25 mg q.h.s.  3.  Zelnorm b.i.d.  4. Ibuprofen 800 mg t.i.d. p.r.n.  5. Hydrocodone four times a day p.r.n.  6. Phentermine 37.5 mg daily which she says she is not taking.  This is a     weight loss agent.  7. Seroquel was just discontinue for possible GI side effects.  8. Metoclopramide 10 mg before meals and at bedtime.  9. Prevacid 30 mg b.i.d.  10.      Ciprofloxacin 500 mg b.i.d. for a urinary tract infection.  This     has been taken for the last 9 days.  11.      Promethazine 25 mg p.r.n.  12.      Colace.  13.      Bisacodyl 5 mg p.r.n.  14.      Darvocet p.r.n.  15.      Lorazepam 1 to 2 mg p.o. t.i.d. p.r.n.  16.      Bextra 10 mg b.i.d. p.r.n.  17.      Cymbalta 60 mg daily.  18.      Neurontin 300 mg q.h.s.   ALLERGIES:  erythromycin, PAXIL, and MACROBID.   FAMILY HISTORY:  Mother had lung and colon cancer.   SOCIAL HISTORY:  Married, nonsmoker, nondrinker.   REVIEW OF SYSTEMS:  GENERAL:  No weight loss or night sweats.  ENDOCRINE: No  known diabetes or thyroid problems.  SKIN:  No rash or pruritus.  EYES: No  icterus or change in vision.  ENT: No aphthous ulcers or chronic sore  throat.  RESPIRATORY: No shortness  of breath, cough, or wheezing.  CARDIAC:  No chest pain, palpitations, or history of valvular heart disease.  GI:  As  above.  GU:  No current dysuria or hematuria.  Just completing therapy with  ciprofloxacin for a UTI.  PSYCHOLOGICAL:  As above.   PHYSICAL EXAMINATION:  GENERAL: She is a well-developed, depressed,  cheerful, adult female vomiting frequently in the office with a protuberant  abdomen.  VITAL SIGNS:  Afebrile. Weight 163.  Blood pressure 110/80, pulse 84 and  regular.  SKIN:  Normal.  HEENT:  Eyes: Anicteric.  Oropharynx unremarkable.  NECK:  Supple without thyromegaly.  ADENOPATHY:  There is no cervical or inguinal adenopathy.  CHEST:  Sounds clear.  CARDIAC:  Heart sounds regular rate and rhythm.  ABDOMEN:  Distended with hypoactive bowel sounds.  There may be a vague  fullness in the left upper quadrant.  I do not appreciate any masses.  There  is generalized mild tenderness with more tenderness in the left upper  quadrant.  RECTAL:  No impaction.  Stool is soft and guaiac negative.  I do not  appreciate any masses or rectal pathology.  EXTREMITIES:  Without cyanosis, clubbing, edema, or rash.   IMPRESSION:  1. Obstipation, rule out bowel obstruction.  The patient may have true     colonic inertial or problems secondary to some of her medications.  Since     she is no better over the past two weeks and looks so distressed in the     office     today, I am admitting her to the hospital to clean out her bowels and     completely reassess the situation.  CT scan, upper and lower endoscopy     will be performed.  All of her nonessential medications are discontinued.     However, I will continue the Symbyax as Cymbalta as prescribed by her     psychiatrist.  Althea Grimmer. Luther Parody, M.D.    PJS/MEDQ  D:  04/02/2003  T:  04/02/2003  Job:  [id number]  cc:   Daine Floras, M.D.  [address]  [place]   [place] [address]  Fax: [phone]

## 2010-11-06 NOTE — Assessment & Plan Note (Signed)
Amber Harrell is a 50 year old married female who is being seen in our pain and  rehabilitative clinic for chronic cervicalgia and some low back pain as  well.  She is back in today for a recheck.  She has undergone cervical  middle branch blocks by Dr. Stevphen Rochester.  Her first one was in April on October 12, 2005.  Her last one was done Nov 02, 2005.   She states she has had improvement in her pain.  The first one lasted  approximately 6 days.  The last one lasted greater than 6 days.  She is  unsure of how many days it had lasted.   Over the last month, Amber Harrell has been tapered on her Xanax by Dr. Lenn Sink.  She has had some difficulty weaning down off of her Xanax and is quite  frustrated about her dependency on this drug.   With that in mind, Amber Harrell states that she is not interested in any type  of narcotic pain medication to treat her pain.  She was trialed on  amitriptyline; however, she did not take it in the last month.   Her average pain, she states, is about a 10 on a scale of 10.  After the  medial branch block wore off, her pain currently today is about a 7.  Her  pain interferes a good deal with her activity level and enjoyment of life.  Sleep is overall poor.  Pain is particularly worse with activity and  improves with heat or ice, TENS unit, and injections.   The pain is located primarily in the posterior cervical region and into the  left scapular/upper scapular region and described as aching, dull and sharp  in nature.   She states that she has had her orthopedic boot on the right removed  approximately 4 weeks ago.  With the removal of the boot, she has noted a  great improvement in her low back and right hip pain.   She is currently undergoing physical therapy for the right ankle which she  sustained a significant sprain to after falling in a store in February 2007.   She was last employed back in June 2004.   REVIEW OF SYSTEMS:  Positive for anxiety, numbness,  tingling, and spasms.   Also has multiple gastrointestinal complaints.   No other changes in past, medical, social, or family history.   PHYSICAL EXAMINATION:  VITAL SIGNS:  On exam today, blood pressure is  141/89, pulse 73, respirations 16, 97% saturated on room air.  GENERAL:  She is a well-developed, mildly obese female who appears her  stated age.  She is oriented x 3.  Her affect is bright, alert.  She is  cooperative and pleasant.  She transitions from sit to stand without  difficulty today.  Her gait is nonantalgic in the room.  She has good heel-  toe mechanics.  Normal base of support is noted.  She has mild limitations  in cervical range of motion.  She has full shoulder range of motion.  Seated  reflexes are symmetric and intact in the upper and lower extremities.  A  right Achilles tendon reflex is difficult to obtain because she is quite  tender in this area yet.  Mild edema is noted around the lateral malleolus  on the right.  Her motor strength is good in both upper and lower  extremities.  No focal weakness is appreciated.  She has normal tone in the  upper and  lower extremities as well.  Sensory examination of the bilateral  upper extremities does not reveal any deficits in the hands.   IMPRESSION:  1.  Cervicalgia.  2.  Multilevel cervical spondylytic changes.  3.  History of anxiety.  4.  Right ankle sprain.  5.  Intermittent paresthesias with history of carpal tunnel syndrome in the      past.   PLAN:  The patient is wearing night splints now.  She had been wearing them  throughout the day but found it somewhat aggravating so limited to evening  and night now.  May consider steroid injection at some point if she does not  have resolution with the use of the splints.   We will have her follow back up with Dr. Stevphen Rochester for cervical radiofrequency.  With the last 2 injections, she has had significant relief lasting at least  6 days for the first one and greater  than 6 days for the second medial  branch block.  She would like to pursue radiofrequency and will discuss that  with him.   She has been using Lidoderm, has trouble getting it to stick.  We discussed  cleansing her skin with soap that does not emollients or using lotions on  the area that she would like to apply the Lidoderm to.  She has been using a  TENS unit; however, she dropped it and it broke and will need to get this  fixed.  She is quite adamant about not using any type of narcotics for  managing her pain.  She is quite frustrated with the difficulty that has  been for her to come off of the Xanax over the last few months.  Regarding  sleep, it is still an issue for her.  She has some Neurontin which she has  used in the past.  It seems to have helped with some arm pain.  She will re-  trial this at 300 mg at night.  She states that in the past she has found it  too sedating to use during the day; however, we may be able to use it at  night for her to help her with sleeping.  For pain management, she will use  Tylenol not more than 4 of 500-mg tablets per day or 6 of 325-mg tablets per  day.  She has an intolerance to nonsteroidal anti-inflammatory medications,  acid reflux, and cannot really take NSAIDs.  We will see her back in a month  and reassess her.  She will follow up with Dr. Stevphen Rochester as planned as well.           ______________________________  Brantley Stage, M.D.     DMK/MedQ  D:  12/29/2005 14:37:27  T:  12/29/2005 17:19:23  Job #:  [id number]

## 2010-11-06 NOTE — Procedures (Signed)
NAME:  Amber Harrell, Amber Harrell NO.:  [id number]   MEDICAL RECORD NO.:  [id number]          PATIENT TYPE:  AMB   LOCATION:  SDS                          FACILITY:  [facility]   PHYSICIAN:  Celene Kras, MD        DATE OF BIRTH:  [birth year]   DATE OF PROCEDURE:  04/05/2006  DATE OF DISCHARGE:                                 OPERATIVE REPORT   Amber Harrell comes to pain management today.  I have reviewed from history of  14 point review of systems.   1. I think we have had a fair to equivocal response to the cervical facet      medial branch RF, and she has broken through.  Apparently she is going      to go on to surgery.  2. Significant trigger points, suprascapular paracervical; will go ahead      and attenuate these to help her with a symptomatic approach, as she is      not going to want to escalate controlled substances.  3. She had some postprocedure discomfort from the RF, which we might      expect.  We usually have the opportunity go on and give analgesic      capacity, but as she did not want to be exposed to any controlled      substances, or pain medications in general, she chose not to go the      route.  She has defervesced.  She is doing fine.  No obvious      complications.   OBJECTIVELY:  Diffuse paracervical myofascial discomfort, left greater than  right.  Suprascapular and levator scapular pain.   PAIN ROTATIONAL CAPACITY:  Range of motion impaired secondary to pain.  Her  typical pain mechanical diskogenic.   IMPRESSION:  Cervical facet syndrome, degenerative cervical spine.   PLAN:  Trigger point injection.  She has consented follow-up postprocedure.  Non-narcotic medication alternatives for symptomatic relief.  She is  consented.  Maintain contact with our surgical colleagues and Dr. Brantley Stage.   PROCEDURE:  The skin was prepped using 60 mg total of Toradol and 20 mg of  Aristocort.  I injected suprascapular paracervical to the left side;  3 in  0.10 at access points under local anesthetic.  This will be with 3 cc  lidocaine divided dose.   She tolerated the procedure well.  No complications for her procedure.  Appropriate recovery.  Discharge instructions given.  No barrier to  communication.  Will see her in follow-up.           ______________________________  Celene Kras, MD     HH/MEDQ  D:  04/05/2006 13:03:07  T:  04/06/2006 14:15:48  Job:  [id number]

## 2010-11-06 NOTE — Assessment & Plan Note (Signed)
Amber Harrell is a 50 year old woman who has been seen in our pain and  rehabilitative clinic for cervicalgia.  She is known to have multilevel  cervical spondylotic changes and radiating neck and bilateral scapular pain.   She has opted for non-narcotic treatment at our pain clinic and had been  managed with Lidoderm and some amitriptyline in the evening.  She was  interested in pursuing medial branch blocks and did well, progressing to RF.  She underwent RF February 01, 2006, Dr. Stevphen Rochester on the left side.   She states that since that date, she has had some increased pain in the left  cervical and periscapular regions throughout the thoracic and lumbar  paraspinal musculature.  She states that she has been having quite a bit of  spasm throughout this entire area for the last couple of weeks.   She has requested non-narcotic management even though her pain has increased  somewhat.  She called the on call doctor over the weekend, Erick Colace, M.D. increased her Neurontin to 300 mg three times daily.  She  noticed some improvement with that.   She states her pain is typically worse in the morning but it is persistent  throughout the day, it is described as aching, sharp, dull in its nature.  Sleep is fair.  She has continued to use medication, TENS units and reports  fair relief with these interventions.   She is able to walk about 10 minutes at a time.  She is able to drive.  She  is independent with her self care with the exception of some household  duties and some shopping.  She admits to anxiety, denies depression,  suicidal ideation, reports bladder control.   She has had some problems with nausea, diarrhea, abdominal pain.  She will  follow up with her primary care physician for these problems.   Otherwise, past medical, social, family history otherwise unchanged.   PHYSICAL EXAMINATION:  VITAL SIGNS:  Blood pressure is 131/80, pulse 53,  respiratory rate 16, 98%  saturation on room air.  GENERAL:  She is a well-developed, well-nourished female who appears her  stated age.  She appears uncomfortable, a little bit diaphoretic in clinic  today.  She is oriented x 3.  Her affect is bright.  She is alert and  cooperative but seems uncomfortable.   She is able to transition from sit to stand without any difficulty.  She  does, however, appear stiff during these maneuvers.  Her gait is normal.  She has normal cadence, normal base of support, normal stride length.  She  is able to do tandem gait and Romberg's test without difficulty.  She had  significant limitations in cervical range of motion and was able to abduct  her shoulders to about 90 degrees.  She displays pain behaviors throughout  these maneuvers.   She reports no new sensory deficits to light touch or pinprick in the upper  and lower extremities.  She has significant paraspinal spasm from the  cervical region throughout the thoracic and lumbar paraspinal musculature,  especially on the left.  She has multiple tender points even in the thoracic  and thoracolumbar junction.   Her reflexes are 2 to 3+ in the upper extremities, 2+ in the lower  extremities.  No abnormal tone is noted.  No clonus is noted.   Coordination is intact.   IMPRESSION:  1. Cervicalgia.  2. Status post cervical radiofrequency, 02/01/2006, left-sided by Dr.  Hansen.  3. Paraspinal spasm in thoracic and thoracolumbar junction.   PLAN:  Will increase Neurontin to 300 mg three times daily and two tablets  at bedtime.  Will add Flexeril 5 mg one by mouth three times daily as needed  for back spasm, #60.  Will obtain MRI to rule out any new cervical lesions.  We will see her back in two weeks.           ______________________________  Brantley Stage, M.D.     DMK/MedQ  D:  02/25/2006 12:00:57  T:  02/25/2006 14:22:00  Job #:  [id number]

## 2010-11-06 NOTE — Assessment & Plan Note (Signed)
Wednesday, June 01, 2006:   Amber Harrell is a 50 year old female who is being seen in our pain and  rehabilitative clinic for prominently cervicalgia. She recently  underwent a cervical decompression for left upper extremity by Dr.  Lovell Sheehan on April 13, 2006. She did well with this surgery. Left upper  extremity pain was relieved. However, she did develop some right upper  extremity pain, which is currently being worked up by Dr. Lovell Sheehan.   Apparently, she has had another MRI scan of her cervical spine. I do not  have these results. She states that she recently saw Dr. Lovell Sheehan and a  right shoulder MRI has been ordered, which has not been done.   She states her average pain in her right upper extremity is about an 8  to a 10 on a scale of 10. Pain goes from the posterior upper scapular  region throughout the right upper extremity to the palm of the right  hand. Not so much into the fingers. She describes some numbness and  tingling into the wrist region.   She states that her pain is fairly constant, tingling, aching, sharp in  nature. Is interfering quite a bit with her sleep. She is frustrated  because she is having quite a bit of problem trying to sleep at night.  The pain is exacerbated with activities which involve using her right  upper extremity. The pain improves with medication. She gets complete  relief when she takes her Percocet. However, she takes about 1 per day,  usually at night, so that she can try to get some sleep.   She is able to walk about 20 minutes at a time. She is able to drive.  She is independent with her self-care. Needs some assistance with higher  level activities.   Health and History Form positive for numbness and tingling, anxiety.  Denies suicidal ideation. Denies depression.   History of some asthma. No other changes in past medical, social or  family history since last visit. She was last seen by me on May 02, 2006.   MEDICATIONS:   At this time, include:  1. Neurontin 600 mg 4 times a day.  2. Percocet 1 p.o. daily. Percocets have been provided by Dr. Lovell Sheehan      currently.  3. She does use Lidoderm and TENS unit as well.   PHYSICAL EXAMINATION:  Blood pressure is 121/59, pulse is 56,  respirations 16, 97% saturated on room air.  She is a well-developed, well-nourished female who appears her stated  age. She does not appear in any distress today. Her affect is alert. She  is bright, cooperative and pleasant. Her speech is clear. She is  oriented x3. She follows commands without any difficulty.   She transitions from sit-to-stand without any problems. She is able to  perform tandem gait as well as Romberg's test adequately. She has a well-  healed scar over her left anterior neck region.   She had some limitations in cervical range of motion. She lacks only  about 10% of her range to the right and to the left with rotation. She  reports discomfort with abduction of the right shoulder at this time.  She is able to abduct to about 90-degrees and reports pain going from  the posterior shoulder region to the wrist.   Reflexes in the upper extremities are symmetric and intact. Lower  extremities are 2+ at the patellar tendons, 0 at the Achilles tendons  bilaterally. Her  motor strength is quite good in both upper extremities.  She does have some pain with shoulder abduction and is difficult for her  to give full strength. External shoulder rotation bilaterally, however,  was quite good.   She reports some decreased sensation over the left deltoid region.  However, pinprick appears to be intact as well as light touch throughout  the right upper extremity. She has some pain with passively abducting  her shoulder as well as internal rotation and external rotation of the  right shoulder. Tenderness with palpation throughout the shoulder  region.   IMPRESSION:  1. Status post cervical decompression by Dr. Lovell Sheehan  with improvement      in the left shoulder and left upper extremity pain.  2. New right upper extremity pain, currently being worked up by Dr.      Lovell Sheehan. The patient is status post an MRI of the cervical spine      and do not have those results. Apparently, a shoulder MRI is also      planned.   May consider electrodiagnostic studies of the right upper extremity.  Will consider this next month depending on what the shoulder MRI shows.  Will go ahead and write her a prescription for amitriptyline 10 mg at  night for her in addition to Neurontin and Percocet. Benefits and risks  of this medication were reviewed with her. She would like to try it. She  will continue to use Lidoderm and TENS unit on a p.r.n. basis as well.  Will see her back in a month.           ______________________________  Brantley Stage, M.D.     DMK/MedQ  D:  06/01/2006 15:55:18  T:  06/01/2006 19:34:58  Job #:  [id number]   cc:   Cristi Loron, M.D.  Fax: [phone]

## 2010-11-06 NOTE — Assessment & Plan Note (Signed)
Amber Harrell is a 50 year old white female who is being followed in our  Pain and Rehabilitative Clinic predominantly for right-sided  cervicalgia, shoulder pain and arm pain.   She is status post a cervical decompression for left upper extremity  pain by Dr. Lovell Sheehan on April 13, 2006.   Her left upper extremity is essentially pain-free, however, over the  last couple of months, she has developed fairly severe right upper  extremity pain and right scapular pain.   She states she has followed up with an orthopedist who had done a  shoulder injection in her right shoulder which did not give her any  relief.   She states her average pain in the scapular and neck region is about an  8 on a scale of 10.  She reports good relief with the current  medications she is on.  From point she is being prescribed Lidoderm on a  p.r.n. basis, amitriptyline 10 mg at night, Flexeril 5 mg up to three  times a day, Neurontin.  She has discontinued Celebrex up to 10 pills a  month as well as Ultracet.  She states her pain is fairly constant in  the right upper extremity.  Using the upper extremity does seem to  exacerbate it, however, she does have a good amount of pain even at  rest.   She is independent with her self-care.  She needs some assistance with  higher level hospital duties.   She admits to numbness and tingling in the right upper extremity, admits  to anxiety, denies suicidal depression.   Apparently she has had some electrodiagnostic studies and it was felt  she does not carpal tunnel syndrome.  I do not have these studies to  review, however.   No other changes in past medical, social or family history.   PHYSICAL EXAMINATION:  GENERAL APPEARANCE:  She is well-developed, well-  nourished female who appears her stated age.  She appears uncomfortable.  VITAL SIGNS:  Blood pressure 123/70, pulse 67, respirations 16, 98%  saturated on room air.  NEUROLOGIC:  She is oriented x3.  Her  affect is alert.  She is  cooperative and pleasant.  Her speech is clear.  She is able to  transition from a sitting position to standing independently.  Her gait  is normal.  She has limitations in cervical range of motion with  rotation both right and left as well as extension.  She tends to stand  with her head rotated slightly to the left.   She is able to abduct her shoulder up to approximately 120 degrees  bilaterally.  She does report some increased pain in the scapular region  with abduction.   Her reflexes are intact in the upper and lower extremities.  She has  decreased sensation over the right deltoid region with pinprick.  Coordination is grossly intact.   IMPRESSION:  1. Status post cervical decompression, Dr. Lovell Sheehan, with improvement      of left shoulder and left upper extremity pain.  2. New right upper extremity pain and right parascapular pain.   PLAN:  Will refill the following medications for her today.  Ultracet 1-  2 p.o. t.i.d. p.r.n. neck pain #180.  She does not need refills on any  other her other medications at this time.  She prefers not to escalate  any narcotics.  We did discuss the possibility of trialing her on  Topamax.  She would like to wait until she talks to Dr.  Jenkins.  She  has discontinued her Neurontin.  We discuss possibly getting her started  in some physical therapy.  She is currently not interested in this. She  believes that her neck discomfort and scapular discomfort is at too high  a level for her to  engage in therapy program at this time.  I will see her back in four  weeks.  She has been stable on medications provided by this clinic.           ______________________________  Brantley Stage, M.D.     DMK/MedQ  D:  07/29/2006 13:51:19  T:  07/29/2006 15:39:04  Job #:  [id number]   cc:   Dr. Lovell Sheehan

## 2010-11-06 NOTE — Assessment & Plan Note (Signed)
Amber Harrell is a 50 year old, married female who has been seen in our  pain and rehab clinic predominantly right shoulder and right arm pain.  She is status post cervical decompression for left upper extremity pain  by Dr. Lovell Sheehan on 04/13/06.  She is back in today, states she has  followed up with Dr. Lovell Sheehan and has often seen an orthopedist for her  right shoulder and is scheduled for right shoulder surgery on 09/12/06.  She will be following up with Dr. Tia Masker, from [facility],  postoperatively as well.   She is back in today for a refill of her Ultracet.  She states her  average pain is a 10 on a scale of 10.  Currently, in the office it is  about a 7.  She describes her pain and tingling, aching, burning, sharp  and dull, located in the area of the right scapula and throughout the  right shoulder and down the right upper extremity to approximately the  mid forearm region.   She reports overall poor sleep.  She reports good relief with the  current medicines that she is prescribed.  She is currently taking 1 to  2 tablets of Ultracet up to 3 times a day on a p.r.n. basis for her  pain.  She also takes Celebrex 200 mg up to twice a day and uses  Lidoderm on a p.r.n. basis as well.   She is independent with her self care.  She does admit to some tingling,  spasms, and anxiety.  Denies depression or suicidal ideation.  Reports  no new problems regarding her medical history.  No new medications.  Denies any new changes in social or family history.   EXAM:  Blood pressure is 104/65, pulse 67, respirations 16, 98%  saturation on room air.  She is a well-developed, well-nourished female  who appears her stated age.  She does not appear in any distress today.  She is oriented x3.  Her affect is bright, alert, cooperative and  pleasant.  Speech is clear.  During her interview, she tends to sit with  her head tilted slightly, laterally flexed slightly to the left.  Standing, she continues  to maintain this slightly left tilted head  posture.  She has limitations and shoulder abduction to approximately 90  degrees and she has limitations and cervical range of motion in all  planes.   Her reflexes are present and slightly brisk in the upper extremities,  present in the lower extremities.  No abnormal tone or clonus is  appreciated.  She has good strength in the upper extremities within a  pain-free range of motion.  I do need to test her upper extremities  isometrically and with the shoulder held at adduction.  She does report  continued numbness in the region of the right deltoid over the thumb and  index finger.  The rest of the arm is intact with light touch.   IMPRESSION:  1. Status post cervical deep compression by Dr. Lovell Sheehan with      improvement of left shoulder and left upper extremity pain.  2. New right upper extremity pain and right parascapular pain.   The patient has followed up with Dr. Lovell Sheehan.  He has apparently  referred her to an orthopedist.  She also has obtained  An orthopedic consultation, Dr. Tia Masker, through [facility] and scheduled  for right shoulder  surgery.   We had discussed the use of Topamax, however, at this time will hold  off  until she is postop; may consider trial it in July or August after she  is finished with the orthopedic surgeon who will follow her  postoperatively.  We will refill her Ultracet, 1to 2 p.o., t.i.d.,  p.r.n. neck or shoulder pain, #180, no refills.  We will see her back in  6 weeks.           ______________________________  Brantley Stage, M.D.     DMK/MedQ  D:  08/29/2006 10:57:20  T:  08/29/2006 11:47:23  Job #:  [id number]

## 2010-11-06 NOTE — Assessment & Plan Note (Signed)
Amber Harrell is a 50 year old married female who is back in to clinic today  for a brief recheck.  She is status post cervical medial branch block by Dr.  Stevphen Rochester October 12, 2005, for cervicalgia related to multilevel cervical  spondylotic changes.   She is back in and reports overall improvement in her neck pain.  She states  currently it is at a 3 on a scale of 10.  She is quite pleased with the  results overall.  She states that she was actually fairly pain free for  several days, up to approximately 6 days of relief.  She is anxious for  another set of injections and possibly moving on to radiofrequency.   Prior to injections, her pain was a 10 on a scale of 10.  Average pain  currently is about an 8.  Pain is described as sharp, dull, stabbing,  aching, worse on the right than on the left at this time. Sleep is overall  poor.  Pain is aggravated by various activities and improves with TENS unit  and the injections.   FUNCTIONAL STATUS:  The patient walks up to 15 minutes at a time.  She is  able to climb stairs.  She currently does not drive.  She is independent  with self care, needs some assistance with meal prep, household duties,  shopping.   Denies depression or suicidal ideation.  Does admit to some anxiety.   No other change in Past Medical, Social, Family History.   PHYSICAL EXAMINATION:  VITAL SIGNS:  Blood pressure 118/72, pulse 72,  respirations 17, 98% saturation on room air.  GENERAL: Well-developed, well-nourished female who appears stated age.  She  is oriented x3.  Her affect is bright, alert, cooperative, and pleasant.  Her gait is somewhat distorted.  Biomechanics are distorted secondary to her  orthopedic boot on the right.  She does, however, display good balance.   Her cervical range of motion is diminished in all planes.  Reflexes are  symmetric and intact in the upper and lower extremities.  Unable to test the  right Achilles, however.  No abnormal tone is  noted.  Toes downgoing on the  right.   Motor strength overall is nonfocal and within normal range.   IMPRESSION:  1.  Cervicalgia.  2.  Multilevel cervical spondylytic changes.  3.  History of anxiety.  4.  Right ankle sprain.  5.  New paresthesia bilateral hands at night and occasionally during the      day.   Incidentally, toward the end of the interview, the patient mentioned she had  some intermittent back pain which may be related to her spine mechanics  secondary to the orthopedic boot.   PLAN:  We will have patient set up to get bilateral wrist splints for  symptoms of what appears to be carpal tunnel syndrome.  We will trial these  for the next month or so.  May consider elective diagnostic studies.   We will also have PT provide flexible lumbar support which she may not need  after she gets rid of the orthopedic boot.   We will have her follow back up with Dr. Stevphen Rochester for repeat medial branch  block therapy.           ______________________________  Brantley Stage, M.D.     DMK/MedQ  D:  11/01/2005 13:36:41  T:  11/01/2005 22:47:42  Job #:  [id number]

## 2010-11-06 NOTE — Assessment & Plan Note (Signed)
Amber Harrell is a 50 year old married white female who is being seen in  our pain and rehabilitative clinic for right upper extremity pain.   She is status post a cervical decompression at C5/6 for left upper  extremity pain, and she did well postoperatively, but then developed  some right upper extremity pain over the insuring months.   She describes the pain as sharp, tingling, and aching in nature from the  posterior shoulder region through the upper arm, and into the forearm.   She recently underwent a right shoulder arthroscopic surgery on September 12, 2006 by Dr. Tia Masker, and is currently under Dr. Salley Slaughter care  postoperatively at this time.  She is currently receiving Percocet  through his office.   Amber Harrell reports today that her pain in the shoulder and arm is no  better status post the arthroscopic surgery.  She states her pain is  averaging 8 or 9 on a scale of 10.  She is continuing to take  medications from his clinic, including:  1. Neurontin 300 mg 3 times a day, and 2 at night.  2. Amitriptyline 10 mg 1 to 2 p.o. nightly.  3. Flexeril p.r.n.   She is currently receiving Percocet from Dr. Salley Slaughter office.  We are  not filling her Ultracet at this time.   She reports getting relatively good relief with the current medications  that she is taking.   Functional status is somewhat diminished with her right arm in a sling.  She is requiring assistance with dressing, bathing, toileting, meal  prep, household duties.  She is able to walk about 20 minutes at a time.  She is able to climb stairs, and she is able to drive.   She admits to some anxiety.  Denies suicidal ideation.  She is  complaining of some bowel problems of constipation.   No other changes in past medical, social or family history since our  last visit, which was on July 29, 2006.   EXAMINATION:  Blood pressure is 104/65.  Pulse 67.  Respirations 16.  Saturations 98% on room air.  She is a  well-developed, well-nourished female who appears her stated  age.  She has her right arm in a sling.  She is oriented x3.  Her speech  is clear.  Her affect is bright.  She is alert, cooperative and  pleasant.  She does not appear in any distress today.  She follows  commands without any difficulty.  She transitions from sitting to  standing without difficulty.  Her gait is normal.  Her balance is good.  She has limitations in cervical range of motion.  She is able to rotate  about 20 degrees to the right, 45 degrees to the left.  She has her head  slightly tilted to the left during our interview.  Flexion and extension  are limited, and do not bother her significantly performing these.  Her  right upper extremity was not evaluated since it was in the sling  postoperatively.  Sensory testing was performed at the hand.  She  reports hypersensitivity in both upper extremities with pinprick exam.  Sensation is intact in all dermatomes with light touch.  She reports  diminished sensation in the right thumb.  Manual muscle testing and  reflexes were not performed in the right upper extremity since she is  postop, and in a sling at this time.   IMPRESSION:  1. Cervicalgia and right upper extremity pain.  2. Status post  cervical decompression by Dr. Lovell Sheehan in the fall of      2007.  3. Status post right shoulder arthroscopic surgery, currently followed      by Dr. Tia Masker, and is 4 weeks postop.   PLAN:  I have asked her to finish her postop followup visits with Dr.  Tia Masker as scheduled.  I would like to see her back in 6 weeks.  If she  continues to have considerable pain and numbness in the right upper  extremity, would like to pursue EMG nerve conduction studies to rule out  carpal tunnel syndrome, and do a radiculopathy screen at that  time as well with EMG.  Recommend Dulcolax tablets or Senokot for  problems with constipation.  Recommend followup Dr. Lovell Sheehan as scheduled  as well in  the upcoming weeks.           ______________________________  Brantley Stage, M.D.     DMK/MedQ  D:  10/12/2006 09:42:21  T:  10/12/2006 10:28:16  Job #:  [id number]   cc:   Cristi Loron, M.D.  Fax: [phone]

## 2010-11-06 NOTE — Assessment & Plan Note (Signed)
Amber Harrell is a 50 year old white female who is being seen in our pain and  rehabilitative clinic for cervicalgia and radiating left upper extremity  pain.  She was back in a day and continues to have pain complaints involving  the left upper extremity and the left cervical region.   She states her pain is about a 10 on a scale of 10, fairly constant, and  aggravating.  The patient is described as dull, tingling, aching and burning  in nature.  She states the pain is typically aggravated by a variety of  activity, improves with heat.  She recently was started on some Neurontin  and given a seven-day course of Celebrex which she states was not  particularly helpful.  She is fairly reluctant to take medications  regularly.  She has only taken the Neurontin maybe not more than once or  twice a day.  She has stated in the past she is not interested in taking  narcotic pain medications.   She has tried Lidoderm that does not stay on very well for her.  She uses a  TENS unit intermittently. She does not find the Flexeril particularly  helpful.  She recently underwent radiofrequency and this was done on February 01, 2006.  She states that it was not beneficial in reducing her pain  levels.   She is able to walk about 10 minutes at a time. She is able to drive. She is  able to climb stairs. She is independent with her self care.  She has some  difficulty with high level activity. She admits to anxiety. Denies suicidal  ideation.  She states she has some swallowing difficulties and fevers and  chills.   She denies any new problems regarding her medical history this month.  No  changes in her medications.  Denies any changes in social or family history.   MEDICATIONS:  Medications prescribed by our clinic last month included:  1. Lidoderm.  2. Flexeril 5 mg up to t.i.d.  However, she does not take this.  3. Neurontin 300 mg one t.i.d. and two at night.  However, she takes      typically not  more than one or two tablets a day and she was on      Celebrex 200 mg for a seven-day course of 200 mg b.i.d.   PHYSICAL EXAMINATION:  VITAL SIGNS:  Blood pressure 127/81, pulse 673,  respirations 16, 97% saturation on room air. Temperature 98.7.  GENERAL:  She is a well-developed, well-nourished female who appears her  stated age.  She does not appear in any distress.  However, as she sits she  intermittently closes her eyes while she talks to me.  NEUROMUSCULAR:  She is oriented x3.  Her affect is somewhat irritable and  anxious.  Her speech is clear.  She has no difficulty following commands.   She transitions from sit to stand without difficulty. She has mild  limitations in cervical range to the right as well as the left. She is able  to move her neck slowly through flexion, extension.  She reports increased  pain with all motion and she has limitations in all planes.  Abduction of  the shoulders increases her pain in the left upper trapezius region. She  lacks full abduction.  She is able to get to about 100 degrees bilaterally  but states she has increased pain with this maneuver.  Reflexes are  symmetric and intact in the upper and lower  extremities.  No abnormal tone  is noted.  No fasciculation.  No clonus is noted.   Tandem gait and Romberg tests are within normal limits.  She has motor  strength in the 5/5 range.  I do not detect any focal weakness in the upper  and lower extremities.   She is quite sensitive to even mild and gentle palpation and touch into the  upper trapezius area, especially on the left and throughout the parascapular  region on the left including the good part of the left deltoid.   IMPRESSION:  1. Cervicalgia.  2. Persistent C6 nerve root irritation.  3. Anxiety.  4. Complaints with swallowing problems.  I am clear what this is related      to.  May consider further neurologic work-up and referral if this      continues to be a persistent  issue.   PLAN:  1. Encourage her to continue use TENS unit as well as Lidoderm.  I have      encouraged her to take her Neurontin as directed.  Will rewrite      Celebrex for her 200 mg one p.o. b.i.d. for 14 days and will trial her      on some Ultracet one to two p.o. b.i.d. for the next couple of weeks      #56 given.  Will have her follow up with Dr. Stevphen Rochester for recheck and may      consider Medrol Dosepak.  Apparently she has followed up with [facility] and has seen Dr. Lovell Sheehan apparently. He has      considered a surgical intervention for her and is tentatively scheduled      for October 24.  Will see her otherwise back in a month.           ______________________________  Brantley Stage, M.D.     DMK/MedQ  D:  03/30/2006 13:46:32  T:  04/01/2006 78:29:56  Job #:  [id number]

## 2010-11-06 NOTE — Assessment & Plan Note (Signed)
Amber Harrell is a 50 year old married female who was initially seen by me on  September 05, 2005.  Prior to that, she had been followed by Dr. Stevphen Rochester.   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   She is back in today.  She has requested up front, at the last visit, she is  interested in non-narcotic management, and overall, she is interested in  using as few oral medications as she can to remain somewhat functional;  however, her average pain, she states, is about a 10/10 and is localized.  Her main complaint is cervicalgia.  She also has some right leg pain, but  her cervicalgia is her main issue today.  She also has a couple-year history  of headaches and pain radiating into the shoulder area bilaterally as well  as throughout the trapezius area bilaterally.   She reports overall poor sleep.  She states that she gets little relief from  current meds that she is on.  She really has just been using Lidoderm for  pain management.  We also have trialed her on a TENS unit, and she continues  that at about 90 minutes per day.  She has used Skelaxin in the past as  well.   She is able to ambulate about 10 minutes at a time.  She is able to climb  stairs.  She does not drive.  She is independent with her self-care and  needs some assistance with meal prep, household duties, and shopping.  She  admits to a variety of complaints on her health and history form, including  anxiety, bowel and bladder problems, shortness of breath, fever, chills,  night sweats, poor appetite, abdominal pain, nausea, vomiting, constipation.   She is followed by a gastrointestinal specialist, Dr. Chales Abrahams, for her GI  symptoms.  She also is followed by Dr. Marijo Sanes, who is her general  practitioner.   No changes in past medical, social, family history since her last visit.   PHYSICAL EXAMINATION:  VITAL SIGNS:  Blood  pressure 118/69, pulse 78,  respirations 16, 98% saturation on room air.  GENERAL:  She is a well-developed and well-nourished female who appears her  stated age.  She is oriented x 3.  Affect is bright, alert, cooperative, and  pleasant.  She wears an orthopedic boot on the right.  She is currently  followed by Dr. Tally Joe for this.  She is able to stand.  Her gait,  biomechanics are obviously distorted secondary to the orthopedic boot on the  right lower extremity; however, her balance is quite good.  Her cervical  range of motion is mildly diminished in all planes.  She has diminished  range of motion to the shoulders as well to about 110 bilaterally.  Reports  increased pain, especially in the pericapsular region when she abducts her  shoulders; however, no obviously intrinsic shoulder pathology is  appreciated.  Seated reflexes are 2+ in the biceps, triceps, brachial  radials, 2+ in the patellar tendons.  Achilles tendon on the left is 1+,  unable to check the Achilles tendon on the right secondary to orthopedic  boot.  No changes in sensation with light touch in the upper extremities.  Motor strength overall is nonfocal.   IMPRESSION:  1.  Cervicalgia.  2.  Multilevel cervical spondylitic changes.  3.  History of anxiety.  4.  Right ankle sprain, currently  in cast, treated by Dr. Tally Joe.   PLAN:  The patient is requesting non-narcotic pain management.  Also  requesting to minimize the use of oral medications.  To this end, we have  prescribed Lidoderm for her, trialed her with a TENS unit.  She has not  found the Lidoderm particularly helpful but uses a TENS unit about 90  minutes a day.  We have trialed her on Lyrica; however, she has not taken it  appropriately, simply because she does not wish to be on many oral  medications at this point.  She has taken it sporadically; therefore, it is  difficult to assess whether this medication has been of any help for her.  However, I will  add Elavil today to see if we can improve her sleep and  overall the pain scores, in a low dose.  I reviewed this medication with her  and went over side effects.  She would like to trial it.  Will give her 10  mg tablet.  She can take 1, up to 2 tablets p.o. q.h.s. #30 given.  She has  also requested to follow back up with Dr. Stevphen Rochester for evaluation for medial  branch block.  Will try to get this set up for her and I will see her back  in a month.           ______________________________  Brantley Stage, M.D.     DMK/MedQ  D:  10/04/2005 17:08:56  T:  10/05/2005 11:19:33  Job #:  [id number]

## 2010-11-06 NOTE — Procedures (Signed)
NAMEMONSERAT, PRESTIGIACOMO NO.:  [id number]   MEDICAL RECORD NO.:  [id number]          PATIENT TYPE:  REC   LOCATION:  [facility]                          FACILITY:  [facility]   PHYSICIAN:  Celene Kras, MD        DATE OF BIRTH:  25-Jun-1960   DATE OF PROCEDURE:  10/12/2005  DATE OF DISCHARGE:                                 OPERATIVE REPORT   PATIENT:  Amber Harrell.   DATE OF BIRTH:  1960-09-22.   SURGEON:  Jewel Baize. Stevphen Rochester, M.D.   [facility].  I evaluated her  via her health and history form and 14-point review of systems.  Review  available imaging and progress to date.   1.  An individual who has had multiple complains, spinal axial in nature,      and she has also had a recent orthopedic injury to the right foot.      Followed by Dr. Lynnette Caffey, apparently a slip and fall in a store.  No      fracture, she is in a walking boot.  This seems to exacerbate her low      back pain.  From a paracervical perspective, she has pain in the      paracervical region described as mostly myofascial and with mechanical      overlay.  Significant pain with side bending, flexion, extension and      lateral rotational capacity.  She also describes referral patterns      consistent with cervical facet syndrome.  We have discussed treatment      limitations and options, and I think it is reasonable to go on to facet      medial branch injection at C3, C4, C5 and C6 as most problematic level      and follow her expectantly.  Positive provocative experience may lead [place]      to consideration for reinjection or possibly RF.  2.  Other modifiable features in health profile discussed.  Recommend      enhancement and activity in the biomechanical model and rehab model      through Dr. Pamelia Hoit.  3.  Review medication.  Another rationale for performing the procedure is to      minimize escalation of controlled substances.   OBJECTIVE:  Diffuse  paracervical myofascial discomfort and positive cervical  facetal compression test, right and left  Suboccipital compression test  positive.  Suprascapular, levator scapular pain.  No new neurological  findings motor, sensory or reflexive.   IMPRESSION:  Cervical facet syndrome, degenerative spine disease of the  cervical spine.   PROCEDURE:  Cervical facet medial branch injection, C3, C4, C5, C6,  contributory innervation addressed.  This will be with C7.  She is  consented.   The patient was taken to the fluoroscopy suite and placed in the supine  position, then prepped and draped in the usual fashion.  Using a 25-gauge  needle, I advanced to the cervical facet medial branch at C3, C4, C5, C6 and  C7, confirming placement in multiple fluoroscopic positions.  I then on the  right and left side, after proper needle placement and demonstration, I  inject 0.5 mL of lidocaine 1% NPF at each level with a total of 40 mg of  Aristocort in divided dose.   The patient tolerated the procedure well, no complications from our  procedure.  Appropriate recovery.   PLAN:  Discharge instructions given.           ______________________________  Celene Kras, MD     HH/MEDQ  D:  10/12/2005 10:26:12  T:  10/13/2005 06:52:30  Job:  [id number]

## 2010-11-06 NOTE — Assessment & Plan Note (Signed)
[name] is a 50 year old married woman who has been followed in  our Pain and Rehabilitative Clinic for chronic neck pain as well as  predominately left shoulder pain.   She underwent left shoulder surgery by Dr. Eulah Pont on Oct 23, 2008.  She  is still undergoing shoulder rehabilitation program and participating in  physical therapy.  She is currently using hydrocodone 10/325 up to 8  tablets per day per Dr. [facility].   After the surgery, she reports overall improvement in her left shoulder  pain.  However, she did develop some right shoulder pain more recently  over the last few week.  She states that she had some imaging studies  done recently and has been referred to Dr. Ophelia Charter.   Her average pain is variable.  It can be up to 10.  She states that she  needs take 2 pain tablets at a time every 6 hours.   Regarding her migraine headaches, she states that she has been doing  very well with respect to her headaches, really has not had any  headaches and she has been using Topamax regularly.  She takes 25 mg 2  tablets in the morning and this has been quite successful in reducing  her migraine headaches to essentially zero.   She reports poor sleep currently because of the left shoulder and neck  pain.  Pain is typically worse with sitting and bending, improves with  the medication and TENS unit.  She reports good relief with current  medications that she is on.   She is independent with self-care.  She is independent with walking,  does not use an assistive device.  She admits to some depression and  anxiety.  Denies suicidal ideation.  She reports some numbness and  tingling into the right upper extremity which goes into the ring and  middle fingers.  Pain radiates down the arm to this area as well.  She  admits to some constipation.  Otherwise, review of systems is negative.   [facility]; Topamax 25 mg  2 tablets once in the morning.   She also takes hydroxyzine and buspirone as well as hydrocodone 10/325  per outside clinic.   Exam today blood pressure is 109/67, pulse 55, respiration 18, 99%  saturated on room air.   She is a well-developed, well-nourished woman who does not appear in any  distress.  She is oriented x3.  Speech is clear.  Affect is bright.  She  is alert, cooperative, and pleasant.  Follows commands without  difficulty, answers my questions appropriately.  Cranial nerves and  coordination are intact.  Her reflexes are 2+ in the upper and lower  extremities.  No abnormal tone or clonus or tremors are appreciated.  Sensory exam is intact to pinprick in both upper extremities.  Motor  strength is quite good.  I do not detect any focal weakness in the upper  extremities today.   Transitioning from sitting to standing is done with ease.  Tandem gait  and Romberg test are performed adequately.   She has significantly improved left shoulder abduction.  She is able to  abduct to approximately 150 degrees today.  Internal-external rotation  are still limited in her right shoulder.  She is able to abduct  approximately 120, complains of pain with abduction today.  She has  limited cervical range of motion especially with rotation to the right,  flexion-extension do  bother her especially in the posterior lateral  right cervical region.   IMPRESSION:  1. Status post left shoulder arthroscopic surgery by Dr. Eulah Pont Oct 23, 2008 with overall improvement in left shoulder pain and range of      motion.  The patient continues to participate in physical therapy      at this time per Dr. Greig Right instructions.  2. Status post cervical decompression Dr. Lovell Sheehan C5-6, May 14, 2006.  3. Right sided cervicalgia with limited right shoulder range of      motion.  4. History of migraines.  5. History of anxiety and depression.   PLAN:  Neck pain and right upper  extremity pain are currently managed by  Dr. Eulah Pont.  Apparently, there has been some imaging studies ordered and  referral to Dr. Ophelia Charter is planned on March 11, 2009.  At that time,  we will refill her Topamax for her for her migraine headaches, and I  asked her to follow back up with Dr. Eulah Pont as well as primary care for  her other medical problems at this time.  We will see her back in 4  months for refill of her Topamax.   She takes her Topamax as prescribed, has not had any problems with side  effects from this medication and reports excellent relief from her  migraine headache with the use of this medicine.           ______________________________  Brantley Stage, M.D.     DMK/MedQ  D:  02/28/2009 12:40:46  T:  03/01/2009 06:45:45  Job #:  [id number]

## 2010-11-06 NOTE — Discharge Summary (Signed)
NAME:  Amber Harrell, Amber Harrell                           ACCOUNT NO.:  [id number]   MEDICAL RECORD NO.:  [id number]                   PATIENT TYPE:  INP   LOCATION:  5705                                 FACILITY:  [facility]   PHYSICIAN:  Althea Grimmer. Luther Parody, M.D.            DATE OF BIRTH:  1960/12/19   DATE OF ADMISSION:  04/02/2003  DATE OF DISCHARGE:  04/04/2003                                 DISCHARGE SUMMARY   DISCHARGE DIAGNOSES:  1. Anxiety and panic attacks.  2. Status post cholecystectomy.  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  4. Probable functional constipation.   HISTORY OF PRESENT ILLNESS:  Amber Harrell is a 50 year old female who  presented to my office on March 21, 2003, complaining of gaseous  distention and constipation.  She was particularly symptomatic after eating.  She had had a prior evaluation by a gastroenterologist in [place] and was  placed on Zelnorm and also taking Colace, Reglan and MiraLax with as-needed  (p.r.n.) magnesium citrate.  She has a longstanding history of abdominal  complaints and reports that she may go as long as every two weeks without  moving her bowels, unless she takes laxatives.  In October of 2000, she has  a normal small-bowel series and reportedly a negative colonoscopy last year.  She does say that she had a history of polyps in the past.  She had her  gallbladder removed for gallstone disease.  Her appetite has not decreased  and she has actually been gaining weight.  She was hospitalized in late  September in [place] for this problem; apparently, she received laxatives  and was sent home.  Her abdominal discomfort seems worse in the left upper  quadrant, where she had a sensation of a lump which cannot be felt on  physical examination.  When she presented to the office on April 02, 2003,  she was tearful and vomiting continuously and complaining of abdominal pain.  For the remainder of her history, please see the admission note.   PHYSICAL EXAMINATION:  VITAL SIGNS:  On admission physical examination, she  was afebrile, weighing 163, blood pressure 110/80, pulse 84 and regular.  ABDOMEN:  Exam was pertinent for a mildly distended abdomen with hypoactive  bowel sounds.  Although there was a vague fullness in the upper quadrant on  the left side, I did not appreciate any masses.  There was generalized mild  tenderness.  RECTAL:  Rectal exam did not reveal any stool impaction.  Stool was guaiac-  negative.   LABORATORY TESTS:  Laboratory tests revealed a hemoglobin of 12, a white  blood count of 6.5.  Metabolic panel revealed an alkaline phosphatase of 149  and a glucose of 162 but otherwise completely normal.  TSH was normal.  Previously, her pancreatic enzymes had been normal.   The patient underwent a CT scan of the abdomen and pelvis that was negative.  It did not demonstrate  any signs of obstruction or mass.   HOSPITAL COURSE:  She received oral preparation for a colonoscopy and upper  endoscopy, which were performed on April 03, 2003; both exams were  completely normal.  There was no sign of any gastric retention or  obstruction within the reach of the endoscopy.  The patient was feeling  better and was fed on the night of the 13th and the morning of the 14th.  She said she began to experience more abdominal distention, however, exam  was unrevealing.  She was sent for a repeat abdominal x-ray, which  demonstrated no ileus or excess gaseous distention or obstruction.  She was  discharged from the hospital.    DISCHARGE MEDICATIONS:  1. Reglan 10 mg before meals and at bedtime.  2. Neurontin 300 mg nightly.  3. Zelnorm 6 mg b.i.d. before meals.  4. Symbyax 6/25 mg nightly.  5. Prevacid 30 mg b.i.d.  6. Ativan 1 mg t.i.d.  7. MiraLax one capful once or twice daily to maintain bowel movements every     other day.   FOLLOWUP:  Return to the office p.r.n.  Follow up with her psychiatrist   regularly.                                                Althea Grimmer. Luther Parody, M.D.    PJS/MEDQ  D:  04/04/2003  T:  04/05/2003  Job:  [id number]   cc:   Daine Floras, M.D.  [address]  [place]  [place] [address]  Fax: [phone]   [facility]  [address]  [place]  [place] [address]  Fax: [phone]

## 2010-11-06 NOTE — Procedures (Signed)
NAMEMARQUASIA, Harrell NO.:  [id number]   MEDICAL RECORD NO.:  [id number]           PATIENT TYPE:   LOCATION:                                 FACILITY:   PHYSICIAN:  Celene Kras, MD        DATE OF BIRTH:  June 13, 1961   DATE OF PROCEDURE:  DATE OF DISCHARGE:                                 OPERATIVE REPORT   1.  [facility] as a      responder to the cervical facet medial branch injection.  She is noting      the right side is still benefiting, some breakthrough, left side      starting to recrudescence. She had almost a months relief.  2.  Other modified with features in health profile discussed.  3.  I will reinforce the injection and follow expectantly.  She may be a      candidate, would go on to RF.   Objectively, diffuse paracervical myofascial, positive cervical compression  test, right and left, left more so than right.  She has no new neurological  findings, motor, sensory, or reflex.   IMPRESSION:  Cervical facet syndrome, degenerative spine disease cervical  spine.   PLAN:  Cervical facet medial branch injection C4, C5, C6 and C7,  contributory innervation addressed under local anesthetic and she is  consented.  Predicated further injection based on need and overall response.  Review of medication.  Review of imaging.   The patient was taken to the fluoroscopy suite and placed in the supine  position and prepped and draped in the usual fashion. Using a 25 gauge  needle, I advanced to the cervical facet at the medial branch of C4, C5, C6,  and C7, contributory innervation addressed.  Confirmed placement.  Injected  each level, with contributory innervation addressed 0.5 mL lidocaine 1% MPF  with a total of 40 mg Aristocort in divided dose.  The patient tolerated the  procedure well.  No complications from the procedure.  Appropriate recovery.  Discharge instructions given.  We will see her in follow  up.           ______________________________  Celene Kras, MD     HH/MEDQ  D:  11/02/2005 12:33:02  T:  11/02/2005 20:23:20  Job:  [id number]

## 2010-11-06 NOTE — Assessment & Plan Note (Signed)
HISTORY OF PRESENT ILLNESS:  Amber [facility]  today and is complaining of paracervical and paralumbar  discomfort. She was referred to [place] by Dr. Tally Joe. She is an individual who  sees Dr. Adella Hare for her low back, ESI is planned, with functional  impairment secondary to low back pain and paracervical discomfort. She has  not had a MRI of the cervical spine. Has had one of her lumbar spine,  describing an HNP. She has not seen our orthopedic colleagues. She is  relating her pain as 10 over 10 on subjective scale. Suprascapular and  levator scapular described as myofascial, with provocative antagonism with  most movements. Para-lumbar is mostly mechanical and myofascial without  overt radicular component, no bowel or bladder disorder. She is stating that  she has adequate endurance. It is described as aching. Made worse with most  activities. No temporal relation to the day. She is disabled due to anxiety,  depression, and low back problems. She has never had surgery. She states no  wish to harm self or others. She is followed by psychiatry.   MEDICATIONS:  Lengthy list of medications.   ALLERGIES:  Allergies attached to chart.   REVIEW OF SYSTEMS:  A 14 point review of systems health and history form  reviewed.   PAST MEDICAL/SURGICAL HISTORY:  Attached to the chart as well. She is  possibly under an opioid contract with Dr. Adella Hare and will try to get  some records. Apparently, [facility] does not do cervical  procedures.   SOCIAL HISTORY/FAMILY HISTORY:  Otherwise noncontributory to the pain  problem.   PHYSICAL EXAMINATION:  GENERAL:  A pleasant, obese female sitting  comfortably in bed. Gait, affect, appearance is normal x3.  HEENT:  Unremarkable.  CHEST:  Clear to auscultation and percussion.  HEART:  Regular rate and rhythm. Without murmur, rub, or gallop.  ABDOMEN:  Obese, benign, and soft. No hepatosplenomegaly.  BACK:   She has diffuse suprascapular, paracervical, and paralumbar  myofascial pain. Pain with side bending, flexion, extension, and it is  distractible to the paralumbar position. Waddell's positive 2 over 5.  Paracervical position is mostly myofascial and mechanical with referred pain  and positive cervical facetal compression test, suprascapular and levator  scapular, right and left.  NEUROLOGIC:  She has no obvious neurological deficit, motor and sensory  reflexive.   IMPRESSION:  Degenerative spinal disease cervical spine, degenerative spinal  disease lumbar spine, and anxiety disorder.   PLAN:  1.  I think we probably ought to go slow with interventional procedures.      Anxiety will play a great role into best outcome positioning and this      needs to be treated, and apparently is in the process of being so.  2.  She does not want to be on controlled substances and she states she does      want to go back to work and so, will help in this enabling profile.      Consider vocational rehabilitation. Consider muscle stimulator TENS      technology to assist.  3.  I will get a MRI of the neck and assess as to the value of      interventional procedure. She has mechanical position and it might be      that cervical facet injections would be of benefit.  4.  ESI is planned, so we do not want to load her up on steroids. I would  like to see how she does expectantly. Risks, complications, and options      of these procedures and overall treatments are discussed. Consider      Lyrica and Lidoderm.   Extensive consultation chaperoned. No barrier to communication. Will answer  data base. Communicate with [facility] and determine further  course of care.           ______________________________  Celene Kras, MD     HH/MedQ  D:  05/18/2005 11:42:11  T:  05/18/2005 21:13:02  Job #:  [id number]   cc:   Dr. Tally Joe

## 2010-11-06 NOTE — Assessment & Plan Note (Signed)
Amber Harrell is a 50 year old married female who is accompanied by her  husband to our pain and rehabilitative clinic.   She is continuing to have cervicalgia as well as right upper extremity  pain.  In the last year, she had undergone cervical decompression by Dr.  Lovell Sheehan in the fall of 2007, and has undergone initial right shoulder  acromioplasty done by Dr. Teodoro Kil, September 12, 2006.  She continues to  have neck pain as well as shoulder and arm pain to her hand.  She states  that her pain is about a 10 on a scale of 10 on the average.  In the  clinic today it is about an 8.  The pain is described as intermittent,  sometimes more constant, sharp, dull, tingling and aching in nature.  Sleep tends to be poor.  She is getting a little relief with the current  meds that she is on.   She needs assistance with dressing, meal prep, and household duties.  She is independent with feeding, bathing, and toileting.  She admits to  some anxiety, denies depression and suicidal ideation.   REVIEW OF SYSTEMS:  Attached to the chart on the health and history  form.   PAST MEDICAL/SOCIAL/FAMILY HISTORY:  Otherwise unchanged since last  visit.   MEDICATIONS PRESCRIBED BY THIS CLINIC:  Topamax 25 mg 1 p.o. t.i.d.,  however, patient discontinued her Topamax after about 2 weeks.  She had  trouble thinking straight, so she discontinued them.  She has been  obtaining Lortab through Dr. Lovell Sheehan.  She was taking up to 3 tablets  per day, and she has received a prescription recently from Dr. Ardelle Park who  has placed her on a some prednisone.   PHYSICAL EXAMINATION:  On exam today, her blood pressure is 121/78,  pulse 58, respirations 16, 97% saturated on room air.  She is a well-developed, well-nourished female who does not appear in  any distress.  She is oriented x3.  Speech is clear, affect is bright,  alert, cooperative and pleasant.  She follows commands without  difficulty.  She transitions from sitting  to standing with ease.  Gait in the room is  normal.  She has diminished cervical range of motion.  She has about 50% of  normal range with rotation right and left, and limitations on extension  as well.  She has limitation in right shoulder range.  She is able to  abduct to about 45 degrees to 50 degrees, and then she is unable to  because of increased pain.  She has slightly brisk reflexes in the upper extremities.  Lower  extremities are in the 2+ range.  The patellar tendon is 1+.  At the  Achilles tendon, no abnormal tone is noted in the lower extremities.  No  clonus is noted.  Babinski is evaluated.  Toes are downgoing.  Tandem gait, Romberg's test is performed adequately.  She has decreased sensation in the right C5-C7 dermatomes.  Motor  strength is intact distally.  She is unable to give full strength in the  right proximal shoulder muscles due to increased pain.   IMPRESSION:  1. Status post right shoulder surgery, acromioplasty, Dr. Teodoro Kil,      September 12, 2006.  2. Status post cervical decompression, Dr. Lovell Sheehan, fall of 2007.  3. Continued right upper extremity pain.  Most likely multifactorial      in nature.  Last MRI was done December 04, 2006.  Ordered by Dr. Ardelle Park,  who now has her on prednisone.   Will refill her narcotics for her.  Apparently, Dr. Lovell Sheehan is no longer  filling her Lortab for her.  Will continue 5/500 one p.o. t.i.d. #90.  I  encouraged her to followup postoperatively with Dr. Teodoro Kil, and  participate in the shoulder  rehabilitation program as prescribed by him.  Will see her back in 2  months.  Nursing visit next month for refill of her medications.           ______________________________  Brantley Stage, M.D.     DMK/MedQ  D:  01/05/2007 14:11:05  T:  01/06/2007 10:07:20  Job #:  [id number]
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
## 2010-11-06 NOTE — Assessment & Plan Note (Signed)
MEDICAL RECORD NUMBER:  [id number]   Amber Harrell is a 50 year old female who is being seen in our pain and  rehabilitative clinic for chronic cervicalgia, some low back pain.   She is back in today for a recheck.  She is scheduled to see Dr. Stevphen Rochester for  cervical radiofrequency.  She has undergone cervical medial branch blocks by  Dr. Stevphen Rochester.  Her first one was on October 12, 2005.  Her last one was Nov 02, 2005.  She states she has had improvement in her pain.  The first one lasted  about 6 days, the next one lasted greater than 6 days.  She is not sure how  many days it did last.  She is scheduled to see Dr. Stevphen Rochester later this month.   She is adamant in reducing the amount of medication she is taking.  She has  refused any kind of narcotic medications to treat her pain.  She has  currently been tapering through her primary care practice her Xanax and has  had some difficulty with this weaning process over the last month.   Her pain is averaging about a 9 on a scale of 10.  It is typically located  in the posterior cervical region and spreads to the upper scapular region  bilaterally.  She states she has poor sleep, describes herself as a rather  anxious person.   Her pain is typically worse with activity and improves with heat,  medication, TENS unit.  Pain is described as sharp, burning, dull, stabbing,  aching variably.   She is able to walk 15 minutes at a time.  She is able to climb stairs.  She  is able to drive.  She is independent with self care for the most part,  needs some assistance with heavier duties in household work or shopping.   Review of symptoms is evaluated on the health and history form.  No changes  in past medical, social, or family history since our last visit, which was  December 29, 2005.   On exam today, her blood pressure is 126/76, pulse 75, respirations 16, 99%  saturated on room air.  She is a well-developed, well-nourished female who  appears her stated  age.  She does not appear in any distress.   She is oriented x3.  Her affect is bright, alert.  She is cooperative,  pleasant, does seem a little bit anxious.   She transitions from sit to stand without much difficulty.  Her gait is  stable.  Romberg's test, tandem gait is within normal limits.  She has some  limitations in cervical range of motion, mainly in rotation to right and  left.  Full shoulder range of motion is appreciated.  Seated reflexes are  symmetric in the upper and lower extremities.  Motor strength is good  throughout.  No obvious sensory deficits are appreciated.  Coordination is  grossly intact.   IMPRESSION:  1. Cervicalgia.  2. Multi-level cervical spondylotic changes.  3. History of anxiety.  4. Intermittent paresthesias with history of carpal tunnel in the past;      however, upon review of a nerve conduction report brought in by the      patient dated May 19, 2005, reveals that she has normal      sensorimotor conduction studies of the right and left median and ulnar      motor nerves.  She did have slowing and absent sensory responses in the  right and left sural nerves.  Also, some slowing in the tibial motor      nerve was noted as well.  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  I reviewed the study with her.  She appeared      surprised, stating that she was told she had a median nerve problem in      both upper extremities.  However, this report is inconsistent with that      assessment at this time.  Certainly her symptoms clinically suggest she      may have some sensory symptoms.  Would consider repeating this study.   At this time, she is not interested in any new medications.  She would like  to continue Neurontin 300 mg at night, possibly going up to 600 over the  next week or so if she can tolerate 300.  Will write her a prescription for  a soft cervical collar which she can use on an intermittent basis, and   followup with Stevphen Rochester as previously planned.  Will see her back in a month.           ______________________________  Brantley Stage, M.D.     DMK/MedQ  D:  01/28/2006 12:37:12  T:  01/28/2006 13:54:41  Job #:  [id number]

## 2010-11-06 NOTE — Assessment & Plan Note (Signed)
Amber Harrell is a 50 year old female who has been seen in our pain and  rehab clinic for predominantly right shoulder and right arm pain. She is  status post a cervical decompression for left upper extremity pain by  Dr. Lovell Sheehan on April 13, 2006.   Her left upper extremity pain continues to be almost a zero on a scale  of 10. She is quite pleased with the improvement in her left upper  extremity. However postoperatively she did develop some right upper  extremity and shoulder pain which she describes as about an 8 or 9 on a  scale of 10. The pain is described as sharp, stabbing, tingling, aching  and has been quite aggravating for her. She reports her sleep is fair.  Pain is typically there whether she is active or inactive. Improves  somewhat with modalities and medications. She is able to walk about 20  minutes at a time. She is currently able to drive. She is independent  with her self care. Three are no changes really on her health and  history form from the last visit.   She is following up with Dr. Melven Sartorius who is at [facility] who has evaluated her right shoulder. She has undergone what  appears to be a trigger point injection as well as a shoulder injection.  She is not sure she has gotten any relief with either of these  interventions however.   No changes in social or family history otherwise.   PHYSICAL EXAMINATION:  VITAL SIGNS: Blood pressure today is 116/76,  pulse 52, respirations 16, saturation 98% on room air.  GENERAL: She is a well-developed, well-nourished female who appears her  stated age. She does not appear to be in distress today. She has had her  hair done. She is smiling when I enter the room.  NEURO: She is oriented x3. Her affect is bright and she is alert and  cooperative. Her speech is clear. She follows commands without any  difficulty. She transitions from sitting to standing easily. She does  have some diminished range of  motion especially with rotation on right  today. Complains of pain especially in the right posterior scapular  region with abduction of the right shoulder.   Her reflexes are symmetric in the upper and lower extremities at 2+. I  do not see any side to side differences. She does have decreased  sensation in the C5-6 dermatome on the right.   Motor strength however is good in both upper extremities.   She has quite a bit of tenderness with palpation in the right scapular  region, there is one point which is especially tender which apparently  has had been recently injected by Dr. Tia Masker.   IMPRESSION:  1. Status post cervical decompression by Dr. Lovell Sheehan with improvement      of left shoulder and left upper extremity pain.  2. New right upper extremity pain and right parascapular pain. Results      of recent MRI are not available to me at this time.   I will go ahead and refill her pain medications. She apparently has an  appointment with Dr. Lovell Sheehan on the 15th.   DISCHARGE MEDICATIONS:  1. Will refill Celebrex 200 mg one p.o. b.i.d. #14.  2. Neurontin 300 mg one p.o. t.i.d. and two q.h.s. with 3 refills      #150.  3. Ultracet 1-2 p.o. b.i.d. p.r.n. right parascapular pain #120, no  refills.  4. Flexeril 5 mg one p.o. t.i.d. p.r.n. right shoulder and arm pain      #60 no refills.  5. Amitriptyline 10 mg 1-2 p.o. q.h.s. #30.  6. Lidoderm 1-3 patches 12 hours on, 12 hours off #90 3 refills.   She has been stable on these medications. She does not display any  aberrant behavior. She uses them appropriately, has no problems with  over sedation or constipation and gets fair relief with them at this  time. Will see her back in 1 month.           ______________________________  Brantley Stage, M.D.     DMK/MedQ  D:  07/01/2006 14:35:48  T:  07/01/2006 21:11:08  Job #:  [id number]

## 2010-11-06 NOTE — Procedures (Signed)
NAME:  Amber Harrell, Amber Harrell NO.:  [id number]   MEDICAL RECORD NO.:  [id number]          PATIENT TYPE:  REC   LOCATION:  [facility]                          FACILITY:  [facility]   PHYSICIAN:  Celene Kras, MD        DATE OF BIRTH:  [birth year]   DATE OF PROCEDURE:  02/01/2006  DATE OF DISCHARGE:                                 OPERATIVE REPORT   Amber Harrell comes to center of pain management today to evaluate and review  health and history form, 14 point review of systems. Hatsuko has demonstrated  clear positive predictive experience with cervical facet medial branch  injection with improved range of motion, functional indices and decreased  medication usage, better restorative sleep and she is recrudescence.  It is  reasonable to go on to RF and I am going to move to the most problematic  side that being the left side and follow expectantly.  We will go to the  contralateral side if necessary, but I would like to see how she does.   Other modified with features in health profile reviewed, home based therapy  and adjunctive approaches to management.   Objectively diffuse paracervical myofascial discomfort with positive  cervical facetal compression test, left greater than right.  Suboccipital  compression test positive, range of motion impaired secondary to pain and  otherwise intact neurologically.   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   PLAN:  Cervical facet medial branch radiofrequency neural ablation most  problematic segments C4, C5 and C6 with contributory innervation addressed  to left side under local anesthetic, 4 mm active tip and she is consented.  Predicated further injection based on need overall response. Follow-up with  Brantley Stage, M.D.  Review of medication.   The patient is appropriately consented with risks including bleeding,  infection, nerve damage, stroke, seizure, death, other  unforeseen problems  not commonly occurring, neuritis, and no relief as potential possibilities  with idiosyncratic reaction to medication, described as possible as well.  She is consented.   The patient is taken to fluoroscopy suite and placed in the supine position  and prepped, draped usual fashion. Using a 22 gauge RF needle, 5 mm active  tip, I advanced to the cervical facet at medial branch C4, C5 and C6 with  contributory innervation addressed, to the left side under local anesthetic  without CSF, heme or paresthesia.  Test block uneventfully, motor sensory  stem.  Reconfirmation needle placement during multiple points during the  procedure. 0.5 mL lidocaine 1% MPF to each level with a total of 40 mg of  Aristocort in divided dose.  We then lesion at 60 degrees for 60 seconds at  each level.   She tolerated procedure well.  No complications from our procedure.  Appropriate recovery.  Discharge instructions given.  I will see her in  follow-up.           ______________________________  Celene Kras, MD     HH/MEDQ  D:  02/01/2006 10:13:53  T:  02/01/2006  13:36:16  Job:  [id number]

## 2010-11-06 NOTE — Assessment & Plan Note (Signed)
Amber Harrell is a 50 year old married white female who is being seen in our  pain and rehabilitative clinic for cervicalgia and previously for radiating  left upper extremity pain.   She underwent a surgical decompression by Dr. Lovell Sheehan on April 13, 2006.  I do not have any surgical notes delineating the procedure at this time.   She is back in today for a recheck; however, she is currently  postoperatively under Dr. Lovell Sheehan' care at this time and a has a follow-up  appointment with him on November 27.   Briefly, she states that her left shoulder and arm pain is completely  resolved; however, she states she has new right upper extremity pain which  is aggravating her significantly at this time.  Average pain is about an 8  on a scale of 10 into the right shoulder region down to the right wrist.   She is currently taking oxycodone/acetaminophen 5/500 mg from Dr. Lovell Sheehan.   She states that she is having difficulty sleeping, but she gets better  relief from the medications at this time.   She is walking about 20 minutes at a time.  She is able to climb stairs and  she is able to drive.   She is independent with feeding, dressing, bathing, toileting.  Needs some  assistance with meal prep, household duties.   REVIEW OF SYSTEMS:  Positive for tingling, anxiety, constipation, diarrhea,  and health and history form are reviewed and are attached to the chart  today.   Past medical, social, family history otherwise unchanged other than that  stated in the history of present illness.   PHYSICAL EXAMINATION:  VITAL SIGNS:  Blood pressure is 111/63, pulse 61,  respirations 16, 100% saturated on room air.  Temperature was not taken  today.  GENERAL:  She has a soft collar in place, and this was not removed.  She is  a well-developed, well-nourished female who appears her stated age.  She is  oriented x3.  Her speech is clear.  Her affect is somewhat irritable.  She  is cooperative,  however, and she follows commands without any difficulties.  MUSCULOSKELETAL/NEUROLOGIC:  Range of motion in her cervical spine was not  assessed today.  The wound was not assessed.  She has limitations in right  upper extremity range, abduction is to approximately 90 degrees today.  Manual muscle testing was not performed on the right due to increased pain.  Her reflexes, however, were symmetric at 2+ at the biceps, triceps,  brachioradialis, 2+ at the patellar tendons, diminished at the Achilles  tendons.  No abnormal tone was appreciated.  She does report decreased  sensation with light touch throughout the entire right upper extremity  compared to the left.   IMPRESSION:  1. Status post cervical decompression by Dr. Lovell Sheehan April 13, 2006,      with improvement in left shoulder and left upper extremity pain.  2. New right upper extremity pain.   PLAN:  During the postoperative period I encouraged Amber Harrell to follow up  with Dr. Lovell Sheehan.  Apparently she does have an appointment with him on  November 27.  I made it clear to her today that I will not be assessing her  neck as she is currently in Dr. Lovell Sheehan' care postoperatively.  If she has  any problems, she should address them to him or his staff.   She states she understands.  I will not be filling any narcotics for her  today.  She does have some Neurontin left from last month.  I gave her  instructions today on how to take that.  She can start out one tablet at  night and increase every 3 days up to 4 tablets a day of the 300 mg  Neurontin.  We will see her back in a month, and she will continue to follow  up with Dr. Lovell Sheehan per his instructions.           ______________________________  Brantley Stage, M.D.     DMK/MedQ  D:  05/02/2006 13:56:07  T:  05/02/2006 14:59:32  Job #:  [id number]   cc:   Cristi Loron, M.D.  Fax: [phone]

## 2010-11-06 NOTE — Assessment & Plan Note (Signed)
Amber Harrell is a 50 year old white female who is being seen in our Pain and  Rehabilitative Clinic for chronic left neck pain and left shoulder pain.  She has been followed in our clinic for approximately 10 months now.  She  has preferred and is fairly adamant about not being prescribed narcotics for  management of her pain.  She has been using Neurontin, Flexeril,  amitriptyline and Lidoderm over the last several months and supplemented  with an occasional non-steroidal, and more recent Medrol Dosepack. She has  undergone 2 medial branch blocks over the last several months as well as a  radiofrequency which was done on February 01, 2006.  After each medial branch  block she got some relief but fairly short-lived, and after the  radiofrequency she has continued to have persistent left shoulder pain at a  rather high level.  Throughout much of the last 10 months her pain has been  around the 8 to the 10 level.  At one point, after medial branch block, she  had gone down to a 3 for a short time.   Over the last several months now, however, she has had persistent left  shoulder and scapular pain without much relief, even after a Medrol  Dosepack.  She is having trouble sleeping.   She reports decreased function.  She can not clean her home, wash her  clothes.  She has some trouble with managing her hair now.  Her Neurontin  has been increased twice now over the last couple of visits and she still  remains at a rather high level.  An MRI was done February 26, 2006 which  essentially showed C5-6 disk material protrusion eccentrically appearing to  encroach upon the left foramen affecting left C6 nerve root in addition to  other spondylotic changes   Her average pain today is about an 8 on a scale of 10.  Her sleep is fair.  Relief with medications is fair at this time.  She reports the pain as  continuous, sharp, burning, dull and aching.  She is able to walk about 10  minutes at a time.   REVIEW OF SYSTEMS:  Attached to chart today.   No changes in past medical, social or family history since our last visit.   EXAMINATION:  VITAL SIGNS:  Blood pressure 127/74, pulse 79, respirations  18, 98% saturated on room air.  GENERAL:  She is a well-developed, well-nourished female who appears her  stated age.  She is oriented x3.  Her affect is somewhat tired.  She has a  somewhat tired appearance but is cooperative and pleasant.  NEUROLOGIC:  She transitions from sit to stand without any difficulty.  Her  tandem gait, she has a little difficulty with it.  Her Romberg test is  negative.  She has significant range of motion limitations with regards to  her cervical range, diminished significantly to the left compared to right.  Little extension today.  Shoulders are limited to not more than 90 degrees  at this time due to increased pain.  Her reflexes are 2+ and symmetric in  the upper and lower extremities.  No abnormal tone is noted.  No clonus is  noted.  Motor strength is in the 5/5 range.  No focal weakness is  appreciated.  She has decreased sensation over the left deltoid region and  into the radial aspect of the thumb.  The rest of her sensory exam is  intact.   MRI scan  dating February 26, 2006 shows some central disk protrusion T4-5  without neural compression.  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  Potentially focally irritating left C6 root.  Spondylosis at  C6-7.  Slightly more prominent towards the left.  Mild  encroachment upon the forearm and on the left, not as marked as at C5-6.   IMPRESSION:  1. Cervicalgia.  2. Most likely some left C6 persistent root irritation.  3. Paraspinal spasm in thoracic and thoracolumbar junction improved from      last visit.  4. Neuropathic left upper extremity pain.   PLAN:  We will start patient back on a non-steroid anti-inflammatory  medication, Celebrex 200 mg 1 p.o. b.i.d., #14, today.  We  will increase her  Neurontin again to 600 mg 1 p.o. t.i.d.  She continues to use Flexeril,  amitriptyline and Lidoderm as well.   Again, she is requesting non-narcotic treatment and we have been honoring  that request.  We will also send a referral to go [facility] to review her MRI and chart for possible referral.  I will see  her back in a month.           ______________________________  Brantley Stage, M.D.     DMK/MedQ  D:  03/11/2006 12:44:56  T:  03/14/2006 13:08:37  Job #:  [id number]

## 2010-12-01 ENCOUNTER — Encounter (HOSPITAL_COMMUNITY): Payer: Medicare Other | Admitting: Physician Assistant

## 2010-12-31 ENCOUNTER — Encounter (HOSPITAL_COMMUNITY): Payer: Medicare Other | Admitting: Physician Assistant

## 2011-01-19 ENCOUNTER — Encounter (HOSPITAL_COMMUNITY): Payer: Medicare Other | Admitting: Physician Assistant

## 2011-01-19 DIAGNOSIS — F410 Panic disorder [episodic paroxysmal anxiety] without agoraphobia: Secondary | ICD-10-CM

## 2011-01-19 DIAGNOSIS — F329 Major depressive disorder, single episode, unspecified: Secondary | ICD-10-CM

## 2011-01-19 DIAGNOSIS — F3289 Other specified depressive episodes: Secondary | ICD-10-CM

## 2011-03-16 ENCOUNTER — Encounter (HOSPITAL_COMMUNITY): Payer: Medicare Other | Admitting: Physician Assistant

## 2011-03-21 ENCOUNTER — Emergency Department (HOSPITAL_COMMUNITY)
Admission: EM | Admit: 2011-03-21 | Discharge: 2011-03-21 | Disposition: A | Discharge status: Home / Self Care | Payer: Medicare Other | Attending: Emergency Medicine | Admitting: Emergency Medicine

## 2011-03-21 DIAGNOSIS — R0602 Shortness of breath: Secondary | ICD-10-CM | POA: Insufficient documentation

## 2011-03-21 DIAGNOSIS — J329 Chronic sinusitis, unspecified: Secondary | ICD-10-CM | POA: Insufficient documentation

## 2011-03-21 DIAGNOSIS — R51 Headache: Secondary | ICD-10-CM | POA: Insufficient documentation

## 2011-03-21 DIAGNOSIS — I10 Essential (primary) hypertension: Secondary | ICD-10-CM | POA: Insufficient documentation

## 2011-03-21 DIAGNOSIS — Z888 Allergy status to other drugs, medicaments and biological substances status: Secondary | ICD-10-CM | POA: Insufficient documentation

## 2011-03-23 ENCOUNTER — Encounter (HOSPITAL_COMMUNITY): Payer: Medicare Other | Admitting: Physician Assistant

## 2011-04-01 LAB — POCT HEMOGLOBIN-HEMACUE: Operator id: 208731

## 2011-05-18 ENCOUNTER — Ambulatory Visit (INDEPENDENT_AMBULATORY_CARE_PROVIDER_SITE_OTHER): Payer: Medicare Other | Admitting: Physician Assistant

## 2011-05-18 DIAGNOSIS — F410 Panic disorder [episodic paroxysmal anxiety] without agoraphobia: Secondary | ICD-10-CM

## 2011-05-18 MED ORDER — CLONAZEPAM 1 MG PO TABS: ORAL_TABLET | ORAL | Status: DC

## 2011-05-18 NOTE — Progress Notes (Signed)
   [facility] Behavioral Health Follow-up Outpatient Visit  DEEM MARMOL [birth year]  Date: 05/18/11   Subjective: The patient reports that she continues to experience a significant amount of anxiety although she denies any further panic attacks. She reports that although she takes a half a milligram of Klonopin at bedtime has rather restless sleep. Also she takes a half of a Klonopin tablet during the day in continues to experience a significant amount of anxiety. She reports that her daughter who is pregnant with twins came to live with her for about 2 months along with her 3 children, ages 58, 62, and 4. This created much stress for the patient and she feels she did not handle the situation well.  There were no vitals filed for this visit.  Mental Status Examination  Appearance: Well groomed and dressed Alert: Yes Attention: good  Cooperative: Yes Eye Contact: Fair Speech: Fast paced Psychomotor Activity: Increased Memory/Concentration: Intact Oriented: person, place, time/date and situation Mood: Anxious Affect: Congruent Thought Processes and Associations: Logical Fund of Knowledge: Fair Thought Content:  Insight: Fair Judgement: Poor  Diagnosis: Panic disorder without agoraphobia.  Treatment Plan: We will increase her Klonopin dose to 1 mg at bedtime and 1/2 mg daily as needed. The patient will return for followup in 2 months.  Mccauley Diehl, PA

## 2011-07-19 ENCOUNTER — Ambulatory Visit (INDEPENDENT_AMBULATORY_CARE_PROVIDER_SITE_OTHER): Payer: Medicare Other | Admitting: Physician Assistant

## 2011-07-19 DIAGNOSIS — F410 Panic disorder [episodic paroxysmal anxiety] without agoraphobia: Secondary | ICD-10-CM

## 2011-07-19 DIAGNOSIS — F329 Major depressive disorder, single episode, unspecified: Secondary | ICD-10-CM

## 2011-07-20 NOTE — Progress Notes (Signed)
   [facility] Behavioral Health Follow-up Outpatient Visit  Amber Harrell 1961/01/11  Date: 07/19/11  Subjective: Amber Harrell presents today to followup on her medication for anxiety. She reports that she is sleeping very well now taking 1 mg of Klonopin at bedtime. She also reports that she takes a half a milligram in the morning on most days. She continues to complain of anxiety with tremulousness, but states that she has poor energy during the day. After reviewing her chart, it is seen that Amber Harrell has been on multiple different SSRIs in the past with multiple intolerable side effects. She also reports that she has been crying frequently. Her primary care provider feels that she may be perimenopausal. She also reports that she is still grieving the loss of her mother who died in 11-20-08. He denies any suicidal or homicidal thoughts. She denies any auditory or visual hallucinations. She reports that her appetite is poor.  There were no vitals filed for this visit.  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  Treatment Plan: We will continue her Klonopin as currently prescribed - 1 mg at bedtime and 1/2 mg daily as needed. We discussed multiple lifestyle changes that could be helpful including regular exercise healthy meals and socialization. Also, I have asked Amber Harrell to contact hospice and [facility] regarding grief counseling. I've also asked her to see her primary care provider to have some lab work done to see if there may be an organic  cause to her poor energy. I suggested having her thyroid checked as well as an anemia panel, and vitamin D and B. levels. Amber Harrell will followup in one month.  Amber Hadlock, PA

## 2011-08-17 ENCOUNTER — Ambulatory Visit (HOSPITAL_COMMUNITY): Payer: Medicare Other | Admitting: Physician Assistant

## 2011-08-17 ENCOUNTER — Other Ambulatory Visit (HOSPITAL_COMMUNITY): Payer: Self-pay | Admitting: *Deleted

## 2011-08-17 DIAGNOSIS — F410 Panic disorder [episodic paroxysmal anxiety] without agoraphobia: Secondary | ICD-10-CM

## 2011-08-17 MED ORDER — CLONAZEPAM 1 MG PO TABS: ORAL_TABLET | ORAL | Status: DC

## 2011-08-31 ENCOUNTER — Ambulatory Visit (INDEPENDENT_AMBULATORY_CARE_PROVIDER_SITE_OTHER): Payer: Medicare Other | Admitting: Physician Assistant

## 2011-08-31 DIAGNOSIS — IMO0001 Reserved for inherently not codable concepts without codable children: Secondary | ICD-10-CM

## 2011-08-31 DIAGNOSIS — F4010 Social phobia, unspecified: Secondary | ICD-10-CM

## 2011-08-31 DIAGNOSIS — F4001 Agoraphobia with panic disorder: Secondary | ICD-10-CM

## 2011-08-31 DIAGNOSIS — M797 Fibromyalgia: Secondary | ICD-10-CM

## 2011-08-31 DIAGNOSIS — F332 Major depressive disorder, recurrent severe without psychotic features: Secondary | ICD-10-CM

## 2011-08-31 MED ORDER — DULOXETINE HCL 30 MG PO CPEP: 30.0000 mg | ORAL_CAPSULE | Freq: Every day | ORAL | Status: DC

## 2011-09-06 NOTE — Progress Notes (Signed)
   [facility] Behavioral Health Follow-up Outpatient Visit  [name] 1960-11-30  Date: 08/31/11   Subjective: Amber Harrell presents today reporting that she is having a very difficult time.she states that her daughter, who is bipolar, came to live with her last week because she is having trouble with DSS and Amber Harrell's grandchildren. She was reports that before her daughter moved in she was already depressed, and having difficulties in her marriage. She reports that without medication she is unable to sleep, and even with medication he she is up most of the night. She reports that she is unable to leave her home, and this appointment is the only time she's left the house in quite some time. She also reports that she is having severe pain from her shoulder to her leg on her right side, and she is taking Nucynta 100 mg every 6 hours as needed.she denies any suicidal or homicidal ideation. She denies any auditory or visual hallucinations.  There were no vitals filed for this visit.  Mental Status Examination  Appearance: well groomed and neatly dressed Alert: Yes Attention: fair  Cooperative: Yes Eye Contact: Good Speech: rapid Psychomotor Activity: Restlessness and Tremor Memory/Concentration: intact Oriented: person, place, time/date and situation Mood: Anxious, extremely Affect: Constricted and Tearful Thought Processes and Associations: Circumstantial and Disorganized Fund of Knowledge: Fair Thought Content:  Insight: Poor Judgement: Fair  Diagnosis: panic disorder, developing agoraphobia; depressive disorder NOS.  Treatment Plan: We will start her on Cymbalta and followup in one month, I have asked her to try to increase her activity level starting with about 15 minutes of daily walking. We'll continue her Klonopin as prescribed.she is encouraged to call between appointments if need be.  Juel Bellerose, PA

## 2011-10-04 ENCOUNTER — Ambulatory Visit (HOSPITAL_COMMUNITY): Payer: Self-pay | Admitting: Physician Assistant

## 2011-10-13 ENCOUNTER — Ambulatory Visit (HOSPITAL_COMMUNITY): Payer: Self-pay | Admitting: Physician Assistant

## 2011-10-13 ENCOUNTER — Other Ambulatory Visit (HOSPITAL_COMMUNITY): Payer: Self-pay

## 2011-10-14 ENCOUNTER — Other Ambulatory Visit (HOSPITAL_COMMUNITY): Payer: Self-pay

## 2011-10-14 DIAGNOSIS — F410 Panic disorder [episodic paroxysmal anxiety] without agoraphobia: Secondary | ICD-10-CM

## 2011-10-14 MED ORDER — CLONAZEPAM 1 MG PO TABS: ORAL_TABLET | ORAL | Status: DC

## 2011-10-21 ENCOUNTER — Ambulatory Visit (INDEPENDENT_AMBULATORY_CARE_PROVIDER_SITE_OTHER): Payer: Self-pay | Admitting: Physician Assistant

## 2011-10-21 ENCOUNTER — Other Ambulatory Visit (HOSPITAL_COMMUNITY): Payer: Self-pay | Admitting: Physician Assistant

## 2011-10-21 DIAGNOSIS — F4001 Agoraphobia with panic disorder: Secondary | ICD-10-CM | POA: Insufficient documentation

## 2011-10-21 MED ORDER — DESVENLAFAXINE SUCCINATE ER 50 MG PO TB24: 50.0000 mg | ORAL_TABLET | Freq: Every day | ORAL | Status: DC

## 2011-10-21 NOTE — Progress Notes (Signed)
   [facility] Behavioral Health Follow-up Outpatient Visit  COLLETTA SPILLERS February 08, 1961  Date: 10/21/2011   Subjective: Emireth presents today to follow up on medications prescribed for her panic disorder. At her last visit we prescribed Cymbalta 30 mg to be taken daily. She reports that the Cymbalta caused nausea and she has been unable to tolerate it. She reports that "sometimes I feel good, sometimes I don't." After some questioning, Floella endorsed that her anxiety was somewhat circumstantial. She reports that she does get some relief from her anxiety when she is in church she also reports that her body aches all over, which prevents her from being able to do work around the house, or other activities. She denies any suicidal or homicidal ideation. She denies any auditory or visual hallucinations.  There were no vitals filed for this visit.  Mental Status Examination  Appearance: Well groomed and dressed Alert: Yes Attention: good  Cooperative: Yes Eye Contact: Good Speech: Clear and even Psychomotor Activity: Restlessness Memory/Concentration: Intact Oriented: person, place, time/date and situation Mood: Anxious Affect: Constricted Thought Processes and Associations: Circumstantial Fund of Knowledge: Fair Thought Content: Normal Insight: Poor Judgement: Fair  Diagnosis: Panic disorder with agoraphobia  Treatment Plan: As she could not tolerate Cymbalta, we will try Pristiq 50 mg daily. We will continue her Klonopin as previously ordered, and she will return for followup at my next available appointment, which is in about 6 weeks.  Rosie Torrez, PA-C

## 2011-11-22 ENCOUNTER — Other Ambulatory Visit (HOSPITAL_COMMUNITY): Payer: Self-pay | Admitting: *Deleted

## 2011-11-22 DIAGNOSIS — F410 Panic disorder [episodic paroxysmal anxiety] without agoraphobia: Secondary | ICD-10-CM

## 2011-11-22 MED ORDER — CLONAZEPAM 1 MG PO TABS: ORAL_TABLET | ORAL | Status: DC

## 2011-12-07 ENCOUNTER — Ambulatory Visit (HOSPITAL_COMMUNITY): Payer: Self-pay | Admitting: Physician Assistant

## 2011-12-30 ENCOUNTER — Other Ambulatory Visit (HOSPITAL_COMMUNITY): Payer: Self-pay | Admitting: *Deleted

## 2011-12-30 DIAGNOSIS — F410 Panic disorder [episodic paroxysmal anxiety] without agoraphobia: Secondary | ICD-10-CM

## 2011-12-30 MED ORDER — CLONAZEPAM 1 MG PO TABS: ORAL_TABLET | ORAL | Status: DC

## 2012-01-20 ENCOUNTER — Ambulatory Visit (HOSPITAL_COMMUNITY): Payer: Self-pay | Admitting: Physician Assistant

## 2012-02-08 ENCOUNTER — Other Ambulatory Visit (HOSPITAL_COMMUNITY): Payer: Self-pay | Admitting: *Deleted

## 2012-02-08 DIAGNOSIS — F410 Panic disorder [episodic paroxysmal anxiety] without agoraphobia: Secondary | ICD-10-CM

## 2012-02-08 MED ORDER — CLONAZEPAM 1 MG PO TABS: ORAL_TABLET | ORAL | Status: DC

## 2012-03-15 ENCOUNTER — Ambulatory Visit (INDEPENDENT_AMBULATORY_CARE_PROVIDER_SITE_OTHER): Payer: Self-pay | Admitting: Physician Assistant

## 2012-03-15 DIAGNOSIS — F39 Unspecified mood [affective] disorder: Secondary | ICD-10-CM

## 2012-03-15 DIAGNOSIS — F4001 Agoraphobia with panic disorder: Secondary | ICD-10-CM

## 2012-03-15 DIAGNOSIS — F329 Major depressive disorder, single episode, unspecified: Secondary | ICD-10-CM | POA: Insufficient documentation

## 2012-03-15 MED ORDER — DESVENLAFAXINE SUCCINATE ER 50 MG PO TB24: 50.0000 mg | ORAL_TABLET | Freq: Every day | ORAL | Status: DC

## 2012-03-15 NOTE — Progress Notes (Signed)
   [facility]al Health Follow-up Outpatient Visit  Amber Harrell 09-Nov-1960  Date: 03/15/2012   Subjective: Amber Harrell presents today to followup on her treatment for anxiety, panic, and agoraphobia. She reports that she never tried the Pristiq that was prescribed for her in May. She could not even recall it being prescribed, and blamed her pharmacy for not giving it to her. She reports she had pneumonia last month, and that has kept her from being able to follow through on appointments. She expresses difficulty in giving anywhere she needs to go on time. She reports that she stays home all the time except to go to church. She did go to [employer] with her husband wants but had to leave early due to her escalating anxiety. She complains of racing thoughts. She is still taking Nucenta for pain issues. She denies any suicidal or homicidal ideation. She denies any auditory or visual hallucinations.  There were no vitals filed for this visit.  Mental Status Examination  Appearance: Well groomed and nicely dressed Alert: Yes Attention: good  Cooperative: Yes Eye Contact: Good Speech: Clear and coherent Psychomotor Activity: Restlessness Memory/Concentration: Intact Oriented: person, place, time/date and situation Mood: Anxious Affect: Inappropriate and Tearful Thought Processes and Associations: Disorganized, Loose and Tangential Fund of Knowledge: Fair Thought Content: Normal Insight: Poor Judgement: Fair  Diagnosis: Panic disorder with agoraphobia, mood disorder NOS  Treatment Plan: We will continue the Klonopin 1 mg at bedtime and 1/2 mg daily as needed, represcribed the prostatic 50 mg daily, and Paz will return for followup as soon as my schedule will allow in approximately 8 weeks. We will consider a mood stabilizer or antipsychotic medication.  Ahliyah Nienow, PA-C

## 2012-03-22 ENCOUNTER — Other Ambulatory Visit (HOSPITAL_COMMUNITY): Payer: Self-pay | Admitting: *Deleted

## 2012-03-22 DIAGNOSIS — F410 Panic disorder [episodic paroxysmal anxiety] without agoraphobia: Secondary | ICD-10-CM

## 2012-03-22 MED ORDER — CLONAZEPAM 1 MG PO TABS: ORAL_TABLET | ORAL | Status: DC

## 2012-05-11 ENCOUNTER — Ambulatory Visit (HOSPITAL_COMMUNITY): Payer: Self-pay | Admitting: Physician Assistant

## 2012-05-16 ENCOUNTER — Other Ambulatory Visit (HOSPITAL_COMMUNITY): Payer: Self-pay | Admitting: *Deleted

## 2012-05-16 DIAGNOSIS — F410 Panic disorder [episodic paroxysmal anxiety] without agoraphobia: Secondary | ICD-10-CM

## 2012-05-16 MED ORDER — CLONAZEPAM 1 MG PO TABS: ORAL_TABLET | ORAL | Status: DC

## 2012-05-16 NOTE — Telephone Encounter (Signed)
Refill authorized by Dr.Arfeen in Jorje Guild, PA absence

## 2012-07-12 ENCOUNTER — Ambulatory Visit (HOSPITAL_COMMUNITY): Payer: Self-pay | Admitting: Physician Assistant

## 2012-07-12 ENCOUNTER — Other Ambulatory Visit (HOSPITAL_COMMUNITY): Payer: Self-pay | Admitting: *Deleted

## 2012-07-12 DIAGNOSIS — F410 Panic disorder [episodic paroxysmal anxiety] without agoraphobia: Secondary | ICD-10-CM

## 2012-07-12 MED ORDER — CLONAZEPAM 1 MG PO TABS: ORAL_TABLET | ORAL | Status: DC

## 2012-08-02 ENCOUNTER — Ambulatory Visit (HOSPITAL_COMMUNITY): Payer: Self-pay | Admitting: Physician Assistant

## 2013-11-15 ENCOUNTER — Other Ambulatory Visit: Payer: Self-pay | Admitting: Internal Medicine

## 2013-11-15 DIAGNOSIS — M549 Dorsalgia, unspecified: Secondary | ICD-10-CM

## 2013-11-19 ENCOUNTER — Ambulatory Visit
Admission: RE | Admit: 2013-11-19 | Discharge: 2013-11-19 | Disposition: A | Discharge status: Home / Self Care | Payer: Medicare Other | Source: Ambulatory Visit | Attending: Internal Medicine | Admitting: Internal Medicine

## 2013-11-19 VITALS — BP 156/90 | HR 74

## 2013-11-19 DIAGNOSIS — M549 Dorsalgia, unspecified: Secondary | ICD-10-CM

## 2013-11-19 MED ORDER — IOHEXOL 300 MG/ML  SOLN
1.0000 mL | Freq: Once | INTRAMUSCULAR | Status: AC | PRN
  Administered 2013-11-19: 1 mL via EPIDURAL

## 2013-11-19 MED ORDER — TRIAMCINOLONE ACETONIDE 40 MG/ML IJ SUSP (RADIOLOGY)
60.0000 mg | Freq: Once | INTRAMUSCULAR | Status: AC
  Administered 2013-11-19: 60 mg via EPIDURAL

## 2013-11-19 NOTE — Discharge Instructions (Signed)

## 2013-12-25 ENCOUNTER — Other Ambulatory Visit: Payer: Self-pay | Admitting: Internal Medicine

## 2013-12-25 DIAGNOSIS — M545 Low back pain, unspecified: Secondary | ICD-10-CM

## 2013-12-25 DIAGNOSIS — G8929 Other chronic pain: Secondary | ICD-10-CM

## 2013-12-26 ENCOUNTER — Ambulatory Visit
Admission: RE | Admit: 2013-12-26 | Discharge: 2013-12-26 | Disposition: A | Discharge status: Home / Self Care | Payer: Medicare Other | Source: Ambulatory Visit | Attending: Internal Medicine | Admitting: Internal Medicine

## 2013-12-26 VITALS — BP 124/73 | HR 75

## 2013-12-26 DIAGNOSIS — M545 Low back pain, unspecified: Secondary | ICD-10-CM

## 2013-12-26 DIAGNOSIS — M5126 Other intervertebral disc displacement, lumbar region: Secondary | ICD-10-CM

## 2013-12-26 DIAGNOSIS — G8929 Other chronic pain: Secondary | ICD-10-CM

## 2013-12-26 MED ORDER — METHYLPREDNISOLONE ACETATE 40 MG/ML INJ SUSP (RADIOLOG
120.0000 mg | Freq: Once | INTRAMUSCULAR | Status: AC
  Administered 2013-12-26: 120 mg via EPIDURAL

## 2013-12-26 MED ORDER — IOHEXOL 180 MG/ML  SOLN
1.0000 mL | Freq: Once | INTRAMUSCULAR | Status: AC | PRN
  Administered 2013-12-26: 1 mL via EPIDURAL

## 2013-12-26 NOTE — Discharge Instructions (Signed)

## 2014-01-22 ENCOUNTER — Other Ambulatory Visit: Payer: Self-pay | Admitting: Internal Medicine

## 2014-01-22 DIAGNOSIS — M542 Cervicalgia: Principal | ICD-10-CM

## 2014-01-22 DIAGNOSIS — G8929 Other chronic pain: Secondary | ICD-10-CM

## 2014-01-25 ENCOUNTER — Ambulatory Visit
Admission: RE | Admit: 2014-01-25 | Discharge: 2014-01-25 | Disposition: A | Discharge status: Home / Self Care | Payer: Medicare Other | Source: Ambulatory Visit | Attending: Internal Medicine | Admitting: Internal Medicine

## 2014-01-25 VITALS — BP 134/84 | HR 76

## 2014-01-25 DIAGNOSIS — M5126 Other intervertebral disc displacement, lumbar region: Secondary | ICD-10-CM

## 2014-01-25 DIAGNOSIS — G8929 Other chronic pain: Secondary | ICD-10-CM

## 2014-01-25 DIAGNOSIS — F4001 Agoraphobia with panic disorder: Secondary | ICD-10-CM

## 2014-01-25 DIAGNOSIS — M542 Cervicalgia: Secondary | ICD-10-CM

## 2014-01-25 MED ORDER — TRIAMCINOLONE ACETONIDE 40 MG/ML IJ SUSP (RADIOLOGY)
60.0000 mg | Freq: Once | INTRAMUSCULAR | Status: AC
  Administered 2014-01-25: 60 mg via EPIDURAL

## 2014-01-25 MED ORDER — IOHEXOL 300 MG/ML  SOLN
1.0000 mL | Freq: Once | INTRAMUSCULAR | Status: AC | PRN
  Administered 2014-01-25: 1 mL via EPIDURAL

## 2014-01-25 NOTE — Discharge Instructions (Signed)

## 2014-08-27 ENCOUNTER — Other Ambulatory Visit: Payer: Self-pay | Admitting: Internal Medicine

## 2014-08-27 DIAGNOSIS — R1310 Dysphagia, unspecified: Secondary | ICD-10-CM

## 2014-09-06 ENCOUNTER — Other Ambulatory Visit: Payer: Medicare Other

## 2015-06-24 ENCOUNTER — Encounter: Payer: Self-pay | Admitting: Gastroenterology

## 2015-08-12 ENCOUNTER — Ambulatory Visit: Payer: Medicare Other | Admitting: Gastroenterology

## 2015-08-21 ENCOUNTER — Ambulatory Visit: Payer: Medicare Other | Admitting: Physician Assistant

## 2015-10-14 ENCOUNTER — Ambulatory Visit (INDEPENDENT_AMBULATORY_CARE_PROVIDER_SITE_OTHER): Payer: Medicare Other | Admitting: Gastroenterology

## 2015-10-14 ENCOUNTER — Encounter: Payer: Self-pay | Admitting: Gastroenterology

## 2015-10-14 VITALS — BP 124/84 | HR 74 | Ht 61.0 in | Wt 152.0 lb

## 2015-10-14 DIAGNOSIS — R140 Abdominal distension (gaseous): Secondary | ICD-10-CM | POA: Diagnosis not present

## 2015-10-14 DIAGNOSIS — R6881 Early satiety: Secondary | ICD-10-CM

## 2015-10-14 DIAGNOSIS — R1310 Dysphagia, unspecified: Secondary | ICD-10-CM | POA: Diagnosis not present

## 2015-10-14 DIAGNOSIS — K5901 Slow transit constipation: Secondary | ICD-10-CM | POA: Diagnosis not present

## 2015-10-14 MED ORDER — NA SULFATE-K SULFATE-MG SULF 17.5-3.13-1.6 GM/177ML PO SOLN: 1.0000 | Freq: Once | ORAL | Status: DC

## 2015-10-14 NOTE — Patient Instructions (Addendum)
You have been scheduled for a colonoscopy/endo. Please follow written instructions given to you at your visit today.  Please pick up your prep supplies at the pharmacy within the next 1-3 days. If you use inhalers (even only as needed), please bring them with you on the day of your procedure. Your physician has requested that you go to www.startemmi.com and enter the access code given to you at your visit today. This web site gives a general overview about your procedure. However, you should still follow specific instructions given to you by our office regarding your preparation for the procedure.  We have sent the following medications to your pharmacy for you to pick up at your convenience: Suprep  If you are age 60 or older, your body mass index should be between 23-30. Your Body mass index is 28.74 kg/(m^2). If this is out of the aforementioned range listed, please consider follow up with your Primary Care Provider.  If you are age 61 or younger, your body mass index should be between 19-25. Your Body mass index is 28.74 kg/(m^2). If this is out of the aformentioned range listed, please consider follow up with your Primary Care Provider.    Thank you for choosing  GI  Dr Wilfrid Lund III

## 2015-10-14 NOTE — Progress Notes (Signed)
[facility] Consult Note:  History: Amber Harrell 10/14/2015  Referring physician: Bonnita Nasuti, MD  Reason for consult/chief complaint: Abdominal Pain; Constipation; and Dysphagia   Subjective HPI:  This is the initial office consult for a 55 year old woman referred for abdominal pain dysphagia and constipation. She reports at least 2 years of bandlike upper abdominal pain with bloating and early satiety after meals. She also describes solid food dysphagia, or symptoms of feel stuck in the chest before will eventually pass. There is occasional nausea and vomiting. She also has years of chronic constipation with passage of small pellet-like stools and no rectal bleeding. She uses Bentyl as needed for cramps, she has had little improvement on Linzess or MiraLAX. As noted below, she is on morphine 30 mg twice a day for about the last year, and it was Vicodin prior to that for her chronic orthopedic pain syndrome. She also had a normal EGD and colonoscopy in October 2004 with [facility]. These studies were reportedly done for constipation and abdominal pain, and she believes that they predated her use of narcotic analgesics. ROS:  Review of Systems  Constitutional: Negative for appetite change and unexpected weight change.  HENT: Negative for mouth sores and voice change.   Eyes: Negative for pain and redness.  Respiratory: Negative for cough and shortness of breath.   Cardiovascular: Negative for chest pain and palpitations.  Gastrointestinal: Positive for vomiting, abdominal pain and constipation.  Genitourinary: Negative for dysuria and hematuria.  Musculoskeletal: Positive for back pain and arthralgias. Negative for myalgias.  Skin: Negative for pallor and rash.  Neurological: Negative for weakness and headaches.  Hematological: Negative for adenopathy.  Psychiatric/Behavioral: The patient is nervous/anxious.      Past Medical History: Past Medical History  Diagnosis  Date  . Arthritis   . Anxiety   . Asthma   . Chronic headaches   . Colon polyps   . Depression   . Diverticulosis   . Fibromyalgia   . GERD (gastroesophageal reflux disease)   . Hyperlipemia   . Hypertension   . IBS (irritable bowel syndrome)   . Sleep apnea      Past Surgical History: Past Surgical History  Procedure Laterality Date  . Abdominal hysterectomy    . Elbow surgery Right   . Shoulder surgery Right   . Csc    . Shoulder surgery Left   . Cholecystectomy  2003     Family History: Family History  Problem Relation Age of Onset  . Colon cancer Mother   . Colon polyps Mother   . Diabetes Mother   . Heart disease Mother   . Irritable bowel syndrome Sister   . Renal Disease Sister   . Pancreatic cancer Mother   . Heart disease Father   . Kidney cancer Sister   . Breast cancer Cousin     1st cousin maternal  . Pancreatic cancer Cousin     1st cousin maternal    Social History: Social History   Social History  . Marital Status: Married    Spouse Name: N/A  . Number of Children: 3  . Years of Education: N/A   Occupational History  . Disabled     Social History Main Topics  . Smoking status: Former Smoker -- 3.00 packs/day for 20 years    Types: Cigarettes    Quit date: 12/26/1989  . Smokeless tobacco: Never Used  . Alcohol Use: No  . Drug Use: No  . Sexual Activity: Not  Asked   Other Topics Concern  . None   Social History Narrative    Allergies: Allergies  Allergen Reactions  . Macrobid [Nitrofurantoin] Anaphylaxis  . Paxil [Paroxetine Hcl] Nausea And Vomiting  . Avelox [Moxifloxacin Hcl In Nacl] Nausea Only  . Erythromycin Nausea Only  . Levaquin [Levofloxacin] Nausea Only  . Ultram [Tramadol] Hives    ER (extended release) only.  Can tolerate immediate release.  . Vicodin [Hydrocodone-Acetaminophen] Other (See Comments)    Increases anxiety     Outpatient Meds: Current Outpatient Prescriptions  Medication Sig Dispense  Refill  . albuterol (PROVENTIL) (2.5 MG/3ML) 0.083% nebulizer solution Take 2.5 mg by nebulization every 6 (six) hours as needed for wheezing or shortness of breath.    . dexlansoprazole (DEXILANT) 60 MG capsule Take 60 mg by mouth 2 (two) times daily at 10 AM and 5 PM.    . Diclofenac Sodium (VOLTAREN TD) Place onto the skin.    Marland Kitchen dicyclomine (BENTYL) 20 MG tablet Take 20 mg by mouth every 6 (six) hours.    Marland Kitchen levalbuterol (XOPENEX HFA) 45 MCG/ACT inhaler Inhale 2 puffs into the lungs every 6 (six) hours as needed for wheezing.    Marland Kitchen linaclotide (LINZESS) 290 MCG CAPS capsule Take 290 mcg by mouth daily before breakfast.    . montelukast (SINGULAIR) 10 MG tablet Take 10 mg by mouth at bedtime.    Marland Kitchen morphine (MS CONTIN) 30 MG 12 hr tablet Take 30 mg by mouth every 12 (twelve) hours.    . Olopatadine HCl (PATADAY) 0.2 % SOLN Apply to eye.    . ondansetron (ZOFRAN) 8 MG tablet Take by mouth every 8 (eight) hours as needed for nausea or vomiting.    . polyethylene glycol (MIRALAX / GLYCOLAX) packet Take 17 g by mouth daily.    . sucralfate (CARAFATE) 1 GM/10ML suspension Take 1 g by mouth 2 (two) times daily.    Marland Kitchen topiramate (TOPAMAX) 50 MG tablet Take 50 mg by mouth daily.    . Vortioxetine HBr (BRINTELLIX) 10 MG TABS Take by mouth.    . clonazePAM (KLONOPIN) 1 MG tablet Take one tablet by mouth at bedtime and 1/2 tablet daily as needed for anxiety 45 tablet 0  . desvenlafaxine (PRISTIQ) 50 MG 24 hr tablet Take 1 tablet (50 mg total) by mouth daily. 30 tablet 1  . Na Sulfate-K Sulfate-Mg Sulf SOLN Take 1 kit by mouth once. 354 mL 0   No current facility-administered medications for this visit.      ___________________________________________________________________ Objective  Exam:  BP 124/84 mmHg  Pulse 74  Ht 5\' 1"  (1.549 m)  Wt 152 lb (68.947 kg)  BMI 28.74 kg/m2   General: this is a(n) Middle-aged woman in no acute distress, good muscle mass   Eyes: sclera anicteric, no  redness  ENT: oral mucosa moist without lesions, no cervical or supraclavicular lymphadenopathy, good dentition  CV: RRR without murmur, S1/S2, no JVD, no peripheral edema  Resp: clear to auscultation bilaterally, normal RR and effort noted  GI: soft, mild upper tenderness to light palpation, with active bowel sounds. No guarding or palpable organomegaly noted.  Skin; warm and dry, no rash or jaundice noted  Neuro: awake, alert and oriented x 3. Normal gross motor function and fluent speech   Assessment: Encounter Diagnoses  Name Primary?  . Slow transit constipation Yes  . Dysphagia   . Postprandial bloating   . 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 Less likely anything obstructive  Plan:    EGD and colonoscopy   The benefits and risks of the planned procedure were described in detail with the patient or (when appropriate) their health care proxy.  Risks were outlined as including, but not limited to, bleeding, infection, perforation, adverse medication reaction leading to cardiac or pulmonary decompensation, or pancreatitis (if ERCP).  The limitation of incomplete mucosal visualization was also discussed.  No guarantees or warranties were given.   If negative, perhaps Relistor might be helpful for constipation, but will probably not improve abdominal pain or dysphagia.   Thank you for the courtesy of this consult.  Please call me with any questions or concerns.  Nelida Meuse III

## 2015-10-22 ENCOUNTER — Telehealth: Payer: Self-pay | Admitting: Gastroenterology

## 2015-10-22 NOTE — Telephone Encounter (Signed)
Call placed to pt. And she stated that she has taken tylenol and that her blood pressure has come down to 154/88 and that she is feeling better. Asked her if she thought her headache is coming from not eating she stated yes and she also wanted to know if she should take her blood pressure medication,instructed pt. To take blood pressure medication and that it is okay to drink pepsi,drain chicken noodle soup and drink broth off of that just don't eat the noodles or anything solid,pt. Stated if I can drink that pepsi i will be okay because she usually drink a soda everyday. She stated that she did feel better and verbalize understanding of instructions. Before hanging up told the pt. To take blood pressure mediction tomorrow in the early a.m. But do not have anything 3 hours before procrdure.

## 2015-10-23 ENCOUNTER — Encounter: Payer: Self-pay | Admitting: Gastroenterology

## 2015-10-23 ENCOUNTER — Ambulatory Visit (AMBULATORY_SURGERY_CENTER): Payer: Medicare Other | Admitting: Gastroenterology

## 2015-10-23 ENCOUNTER — Telehealth: Payer: Self-pay | Admitting: Gastroenterology

## 2015-10-23 VITALS — BP 161/59 | HR 65 | Temp 98.2°F | Resp 16 | Ht 61.0 in | Wt 152.0 lb

## 2015-10-23 DIAGNOSIS — K5901 Slow transit constipation: Secondary | ICD-10-CM | POA: Diagnosis not present

## 2015-10-23 DIAGNOSIS — D123 Benign neoplasm of transverse colon: Secondary | ICD-10-CM | POA: Diagnosis not present

## 2015-10-23 DIAGNOSIS — R1310 Dysphagia, unspecified: Secondary | ICD-10-CM | POA: Diagnosis not present

## 2015-10-23 DIAGNOSIS — D124 Benign neoplasm of descending colon: Secondary | ICD-10-CM

## 2015-10-23 MED ORDER — SODIUM CHLORIDE 0.9 % IV SOLN: 500.0000 mL | INTRAVENOUS | Status: DC

## 2015-10-23 NOTE — Progress Notes (Signed)
A/ox3 pleased with MAC, report to Jane RN 

## 2015-10-23 NOTE — Patient Instructions (Signed)

## 2015-10-23 NOTE — Progress Notes (Signed)
Pt was a poor historian about her medications and her history. maw

## 2015-10-23 NOTE — Op Note (Signed)
[place] Patient Name: Amber Harrell Procedure Date: 10/23/2015 2:06 PM MRN: [id number] Endoscopist: Mallie Mussel L. Loletha Carrow , MD Age: 56 Date of Birth: 1960-09-04 Gender: Female Procedure:                Upper GI endoscopy Indications:              Upper abdominal pain, Dysphagia Medicines:                Monitored Anesthesia Care Procedure:                Pre-Anesthesia Assessment:                           - Prior to the procedure, a History and Physical                            was performed, and patient medications and                            allergies were reviewed. The patient's tolerance of                            previous anesthesia was also reviewed. The risks                            and benefits of the procedure and the sedation                            options and risks were discussed with the patient.                            All questions were answered, and informed consent                            was obtained. Prior Anticoagulants: The patient has                            taken no previous anticoagulant or antiplatelet                            agents. ASA Grade Assessment: II - A patient with                            mild systemic disease. After reviewing the risks                            and benefits, the patient was deemed in                            satisfactory condition to undergo the procedure.                           After obtaining informed consent, the endoscope was  passed under direct vision. Throughout the                            procedure, the patient's blood pressure, pulse, and                            oxygen saturations were monitored continuously. The                            Model GIF-[id number]) scope was introduced                            through the mouth, and advanced to the second part                            of duodenum. The upper GI endoscopy was      accomplished without difficulty. The patient                            tolerated the procedure well. Scope In: Scope Out: Findings:                 The esophagus was normal.                           The stomach was normal.                           The examined duodenum was normal.                           The cardia and gastric fundus were normal on                            retroflexion. Complications:            No immediate complications. Estimated Blood Loss:     Estimated blood loss: none. Impression:               - Normal esophagus.                           - Normal stomach.                           - Normal examined duodenum.                           - No specimens collected.                           - This appears to be a motility issue, most likely                            related to chronic narcotic analgesic use. Recommendation:           - Patient has a contact number available for  emergencies. The signs and symptoms of potential                            delayed complications were discussed with the                            patient. Return to normal activities tomorrow.                            Written discharge instructions were provided to the                            patient.                           - Resume previous diet.                           - Continue present medications.                           - See the other procedure note for documentation of                            additional recommendations. Brodey Bonn L. Loletha Carrow, MD 10/23/2015 2:48:21 PM This report has been signed electronically.

## 2015-10-23 NOTE — Progress Notes (Signed)
Called to room to assist during endoscopic procedure.  Patient ID and intended procedure confirmed with present staff. Received instructions for my participation in the procedure from the performing physician.  

## 2015-10-23 NOTE — Op Note (Signed)
[place] Patient Name: Amber Harrell Procedure Date: 10/23/2015 2:15 PM MRN: [id number] Endoscopist: Mallie Mussel L. Loletha Carrow , MD Age: 55 Date of Birth: 1961/01/26 Gender: Female Procedure:                Colonoscopy Indications:              Lower abdominal pain, Family history of colon                            cancer in a first-degree relative, Constipation Medicines:                Monitored Anesthesia Care Procedure:                Pre-Anesthesia Assessment:                           - Prior to the procedure, a History and Physical                            was performed, and patient medications and                            allergies were reviewed. The patient's tolerance of                            previous anesthesia was also reviewed. The risks                            and benefits of the procedure and the sedation                            options and risks were discussed with the patient.                            All questions were answered, and informed consent                            was obtained. Prior Anticoagulants: The patient has                            taken no previous anticoagulant or antiplatelet                            agents. ASA Grade Assessment: II - A patient with                            mild systemic disease. After reviewing the risks                            and benefits, the patient was deemed in                            satisfactory condition to undergo the procedure.                           -  Prior to the procedure, a History and Physical                            was performed, and patient medications and                            allergies were reviewed. The patient's tolerance of                            previous anesthesia was also reviewed. The risks                            and benefits of the procedure and the sedation                            options and risks were discussed with the patient.            All questions were answered, and informed consent                            was obtained. Prior Anticoagulants: The patient has                            taken no previous anticoagulant or antiplatelet                            agents. ASA Grade Assessment: II - A patient with                            mild systemic disease. After reviewing the risks                            and benefits, the patient was deemed in                            satisfactory condition to undergo the procedure.                           After obtaining informed consent, the colonoscope                            was passed under direct vision. Throughout the                            procedure, the patient's blood pressure, pulse, and                            oxygen saturations were monitored continuously. The                            Model CF-HQ190L [id number]) scope was introduced                            through the anus and advanced to  the the cecum,                            identified by appendiceal orifice and ileocecal                            valve. The colonoscopy was performed without                            difficulty. The patient tolerated the procedure                            well. The quality of the bowel preparation was                            excellent. The ileocecal valve, appendiceal                            orifice, and rectum were photographed. The bowel                            preparation used was Miralax. Scope In: 2:16:55 PM Scope Out: 2:41:41 PM Scope Withdrawal Time: 0 hours 21 minutes 14 seconds  Total Procedure Duration: 0 hours 24 minutes 46 seconds  Findings:                 The perianal and digital rectal examinations were                            normal.                           Two sessile polyps were found in the proximal                            transverse colon and hepatic flexure. The polyps                            were 4 to 8  mm in size. These polyps were removed                            with a cold snare. Resection and retrieval were                            complete.                           A 4 mm polyp was found in the proximal descending                            colon. The polyp was sessile. The polyp was removed                            with a cold snare. Resection and retrieval were  complete.                           A 8 mm polyp was found in the proximal descending                            colon. The polyp was sessile. The polyp was removed                            with a hot snare. Resection and retrieval were                            complete.                           A 8 mm polyp was found in the distal descending                            colon. The polyp was semi-pedunculated. The polyp                            was removed with a hot snare. Resection and                            retrieval were complete.                           The exam was otherwise without abnormality on                            direct and retroflexion views. Complications:            No immediate complications. Estimated Blood Loss:     Estimated blood loss: none. Impression:               - Two 4 to 8 mm polyps in the proximal transverse                            colon and at the hepatic flexure, removed with a                            cold snare. Resected and retrieved.                           - One 4 mm polyp in the proximal descending colon,                            removed with a cold snare. Resected and retrieved.                           - One 8 mm polyp in the proximal descending colon,                            removed with a hot snare. Resected and retrieved.                           -  One 8 mm polyp in the distal descending colon,                            removed with a hot snare. Resected and retrieved.                           - The examination was  otherwise normal on direct                            and retroflexion views. Recommendation:           - Patient has a contact number available for                            emergencies. The signs and symptoms of potential                            delayed complications were discussed with the                            patient. Return to normal activities tomorrow.                            Written discharge instructions were provided to the                            patient.                           - Resume previous diet.                           - No aspirin, ibuprofen, naproxen, or other                            non-steroidal anti-inflammatory drugs for 5 days                            after polyp removal.                           - Await pathology results.                           - Repeat colonoscopy is recommended for                            surveillance. The colonoscopy date will be                            determined after pathology results from today's                            exam become available for review. Larell Baney L. Loletha Carrow, MD 10/23/2015 2:54:07 PM This report has been signed electronically.

## 2015-10-23 NOTE — Telephone Encounter (Signed)
Amber Harrell,    Please find some patient-centered information about a medication called Relistor that can be printed and mailed to this patient.  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 Thanks - HD

## 2015-10-24 NOTE — Telephone Encounter (Signed)
  Follow up Call-  Call back number 10/23/2015  Post procedure Call Back phone  # #[phone]  Permission to leave phone message Yes    Patient was called for follow up after her procedure on 10/23/2015. No answer at the number given for follow up phone call. A message was left on the answering machine.

## 2015-10-27 NOTE — Telephone Encounter (Signed)
Relistor drug information mailed to pts address on file.

## 2015-10-28 ENCOUNTER — Encounter: Payer: Self-pay | Admitting: Gastroenterology

## 2015-11-13 ENCOUNTER — Telehealth: Payer: Self-pay | Admitting: Gastroenterology

## 2015-11-14 NOTE — Telephone Encounter (Signed)
Dr Loletha Carrow the pt reviewed the information on Relistor and she would like a prescription sent to the pharmacy for her to try.  Do you want me to send that in for her?

## 2015-11-14 NOTE — Telephone Encounter (Signed)
Left message on machine to call back  

## 2015-11-14 NOTE — Telephone Encounter (Signed)
Please have her stop by the lab soon to draw a creatinine level.  I just need to be sure her kidney function is normal before dosing that medication.  If creatinine level is normal, then please send prescription to start the oral form, because it is easier to use than the SQ injection.   The oral dose is one 450 milligram tablet once daily. Send a Rx for 14 days, no refills, then have her call and let us know if it helped. She would have to stop both linzess and miralax when beginning the relistor.  Thanks.  - HD

## 2015-11-18 NOTE — Telephone Encounter (Signed)
No answer and voice mail not set up on preferred communication number and mobile number has been disconnected.

## 2015-11-18 NOTE — Telephone Encounter (Signed)
Patient returned phone call. Best # [phone]

## 2015-11-19 NOTE — Telephone Encounter (Signed)
Patient returned phone call. Best # [phone] or [phone]

## 2015-11-19 NOTE — Telephone Encounter (Signed)
No answer on the 336 number, I was able to leave a message on [phone]

## 2015-11-20 ENCOUNTER — Telehealth: Payer: Self-pay | Admitting: Gastroenterology

## 2015-11-20 NOTE — Telephone Encounter (Signed)
Dr Loletha Carrow I spoke with the pt this morning and she states her PCP Dr Jannette Fogo gave her 1 SQ injection of Relistor yesterday and it caused her to have chest pain.  She went to the ED for evaluation and was told to put Relistor on her allergy list.  Please advise, she states she is still constipated on miralax 17 g daily and linzess 290 mcg daily.

## 2015-11-20 NOTE — Telephone Encounter (Signed)
That is unfortunate, since I told her Relistor was the only remaining medication I had to offer her. I am afraid I cannot be of further assistance.

## 2015-11-21 NOTE — Telephone Encounter (Signed)
Left message on machine to call back  

## 2015-11-21 NOTE — Telephone Encounter (Signed)
See alternate phone note  

## 2015-11-21 NOTE — Telephone Encounter (Signed)
Pt notified of Dr Corena Pilgrim recomendation and she will try taking the miralax daily as opposed to prn and call back and update on her condition

## 2016-03-30 ENCOUNTER — Ambulatory Visit: Payer: Medicare Other | Admitting: Gastroenterology

## 2016-03-30 ENCOUNTER — Other Ambulatory Visit: Payer: Self-pay

## 2016-05-19 ENCOUNTER — Encounter: Payer: Self-pay | Admitting: Gastroenterology

## 2016-05-19 ENCOUNTER — Other Ambulatory Visit (INDEPENDENT_AMBULATORY_CARE_PROVIDER_SITE_OTHER): Payer: Medicare Other

## 2016-05-19 ENCOUNTER — Ambulatory Visit (INDEPENDENT_AMBULATORY_CARE_PROVIDER_SITE_OTHER): Payer: Medicare Other | Admitting: Gastroenterology

## 2016-05-19 ENCOUNTER — Encounter (INDEPENDENT_AMBULATORY_CARE_PROVIDER_SITE_OTHER): Payer: Self-pay

## 2016-05-19 VITALS — BP 118/74 | HR 72 | Ht 61.0 in | Wt 168.2 lb

## 2016-05-19 DIAGNOSIS — T402X5A Adverse effect of other opioids, initial encounter: Secondary | ICD-10-CM | POA: Diagnosis not present

## 2016-05-19 DIAGNOSIS — K625 Hemorrhage of anus and rectum: Secondary | ICD-10-CM

## 2016-05-19 DIAGNOSIS — K5903 Drug induced constipation: Secondary | ICD-10-CM | POA: Diagnosis not present

## 2016-05-19 LAB — CREATININE, SERUM: Creatinine, Ser: 0.92 mg/dL (ref 0.40–1.20)

## 2016-05-19 NOTE — Progress Notes (Signed)
[facility] Progress Note  Chief Complaint: Constipation and rectal bleeding  Subjective  History:  Al was sent back to primary care for abdominal bloating and rectal bleeding. 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 She has ongoing severe anxiety and says she has been very worried that she might of colon cancer because of his bleeding. She's had no improvement with MiraLAX and Linzess  ROS: Cardiovascular:  no chest pain Respiratory: no dyspnea  The patient's Past Medical, Family and Social History were reviewed and are on file in the EMR.  Objective:  Med list reviewed  Vital signs in last 24 hrs: Vitals:   05/19/16 1601  BP: 118/74  Pulse: 72    Physical Exam  She is very anxious appearing and tremulous.  HEENT: sclera anicteric, oral mucosa moist without lesions  Neck: supple, no thyromegaly, JVD or lymphadenopathy  Cardiac: RRR without murmurs, S1S2 heard, no peripheral edema  Pulm: clear to auscultation bilaterally, normal RR and effort noted  Abdomen: soft, no tenderness, with active bowel sounds. No guarding or palpable hepatosplenomegaly.  Skin; warm and dry, no jaundice or rash Rectal: Normal sphincter tone, no fissure or palpable internal lesion, nontender Exam chaperoned by our [place]:  Her colon polyps were tubular adenomas   @ASSESSMENTPLANBEGIN @ Assessment: Encounter Diagnoses  Name Primary?  . 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 Rectal bleeding     Plan: Benign anal bleeding due to opioid-induced constipation Trial movantik Creatinine today   Total time 20 minutes, over half spent in counseling and coordination of care.   Nelida Meuse III

## 2016-05-19 NOTE — Patient Instructions (Addendum)
If you are age 55 or older, your body mass index should be between 23-30. Your Body mass index is 31.78 kg/m. If this is out of the aforementioned range listed, please consider follow up with your Primary Care Provider.  If you are age 64 or younger, your body mass index should be between 19-25. Your Body mass index is 31.78 kg/m. If this is out of the aformentioned range listed, please consider follow up with your Primary Care Provider.   Your physician has requested that you go to the basement for the following lab work before leaving today: Creatinine   After we call you with your lab results,  Start Movantik - one tablet once daily, 1-2 hours before a meal.  If it is working, let us know and we will send a prescription.  Thank you for choosing [facility]  Dr Wilfrid Lund III

## 2016-07-07 ENCOUNTER — Encounter (HOSPITAL_COMMUNITY): Admission: EM | Disposition: A | Discharge status: Home / Self Care | Payer: Self-pay | Source: Home / Self Care

## 2016-07-07 ENCOUNTER — Observation Stay (HOSPITAL_COMMUNITY): Payer: Medicare Other | Admitting: Certified Registered Nurse Anesthetist

## 2016-07-07 ENCOUNTER — Inpatient Hospital Stay (HOSPITAL_COMMUNITY)
Admission: EM | Admit: 2016-07-07 | Discharge: 2016-07-08 | DRG: 340 | Disposition: A | Discharge status: Home / Self Care | Payer: Medicare Other | Attending: Surgery | Admitting: Surgery

## 2016-07-07 ENCOUNTER — Encounter (HOSPITAL_COMMUNITY): Payer: Self-pay

## 2016-07-07 DIAGNOSIS — Z800 Family history of malignant neoplasm of digestive organs: Secondary | ICD-10-CM

## 2016-07-07 DIAGNOSIS — Z825 Family history of asthma and other chronic lower respiratory diseases: Secondary | ICD-10-CM

## 2016-07-07 DIAGNOSIS — M797 Fibromyalgia: Secondary | ICD-10-CM | POA: Diagnosis present

## 2016-07-07 DIAGNOSIS — I959 Hypotension, unspecified: Secondary | ICD-10-CM | POA: Diagnosis not present

## 2016-07-07 DIAGNOSIS — G43909 Migraine, unspecified, not intractable, without status migrainosus: Secondary | ICD-10-CM | POA: Diagnosis present

## 2016-07-07 DIAGNOSIS — J45909 Unspecified asthma, uncomplicated: Secondary | ICD-10-CM | POA: Diagnosis not present

## 2016-07-07 DIAGNOSIS — Z833 Family history of diabetes mellitus: Secondary | ICD-10-CM

## 2016-07-07 DIAGNOSIS — Z87442 Personal history of urinary calculi: Secondary | ICD-10-CM | POA: Diagnosis not present

## 2016-07-07 DIAGNOSIS — Z981 Arthrodesis status: Secondary | ICD-10-CM | POA: Diagnosis not present

## 2016-07-07 DIAGNOSIS — Z841 Family history of disorders of kidney and ureter: Secondary | ICD-10-CM | POA: Diagnosis not present

## 2016-07-07 DIAGNOSIS — E785 Hyperlipidemia, unspecified: Secondary | ICD-10-CM | POA: Diagnosis not present

## 2016-07-07 DIAGNOSIS — F4001 Agoraphobia with panic disorder: Secondary | ICD-10-CM | POA: Diagnosis present

## 2016-07-07 DIAGNOSIS — Z79891 Long term (current) use of opiate analgesic: Secondary | ICD-10-CM

## 2016-07-07 DIAGNOSIS — Z90710 Acquired absence of both cervix and uterus: Secondary | ICD-10-CM

## 2016-07-07 DIAGNOSIS — K219 Gastro-esophageal reflux disease without esophagitis: Secondary | ICD-10-CM | POA: Diagnosis present

## 2016-07-07 DIAGNOSIS — Z86010 Personal history of colonic polyps: Secondary | ICD-10-CM

## 2016-07-07 DIAGNOSIS — J449 Chronic obstructive pulmonary disease, unspecified: Secondary | ICD-10-CM | POA: Diagnosis present

## 2016-07-07 DIAGNOSIS — K353 Acute appendicitis with localized peritonitis: Secondary | ICD-10-CM | POA: Diagnosis not present

## 2016-07-07 DIAGNOSIS — Z8371 Family history of colonic polyps: Secondary | ICD-10-CM | POA: Diagnosis not present

## 2016-07-07 DIAGNOSIS — G4700 Insomnia, unspecified: Secondary | ICD-10-CM | POA: Diagnosis present

## 2016-07-07 DIAGNOSIS — Z87891 Personal history of nicotine dependence: Secondary | ICD-10-CM

## 2016-07-07 DIAGNOSIS — G894 Chronic pain syndrome: Secondary | ICD-10-CM | POA: Diagnosis present

## 2016-07-07 DIAGNOSIS — Z8051 Family history of malignant neoplasm of kidney: Secondary | ICD-10-CM

## 2016-07-07 DIAGNOSIS — Z8249 Family history of ischemic heart disease and other diseases of the circulatory system: Secondary | ICD-10-CM

## 2016-07-07 DIAGNOSIS — F329 Major depressive disorder, single episode, unspecified: Secondary | ICD-10-CM | POA: Diagnosis present

## 2016-07-07 DIAGNOSIS — I10 Essential (primary) hypertension: Secondary | ICD-10-CM | POA: Diagnosis present

## 2016-07-07 DIAGNOSIS — Z803 Family history of malignant neoplasm of breast: Secondary | ICD-10-CM | POA: Diagnosis not present

## 2016-07-07 DIAGNOSIS — F32A Depression, unspecified: Secondary | ICD-10-CM | POA: Diagnosis present

## 2016-07-07 DIAGNOSIS — F411 Generalized anxiety disorder: Secondary | ICD-10-CM | POA: Diagnosis present

## 2016-07-07 DIAGNOSIS — R1031 Right lower quadrant pain: Secondary | ICD-10-CM | POA: Diagnosis present

## 2016-07-07 DIAGNOSIS — K5900 Constipation, unspecified: Secondary | ICD-10-CM | POA: Diagnosis present

## 2016-07-07 DIAGNOSIS — G4733 Obstructive sleep apnea (adult) (pediatric): Secondary | ICD-10-CM | POA: Diagnosis present

## 2016-07-07 DIAGNOSIS — M810 Age-related osteoporosis without current pathological fracture: Secondary | ICD-10-CM | POA: Diagnosis present

## 2016-07-07 DIAGNOSIS — K3580 Unspecified acute appendicitis: Secondary | ICD-10-CM

## 2016-07-07 HISTORY — PX: LAPAROSCOPIC APPENDECTOMY: SHX408

## 2016-07-07 HISTORY — DX: Essential (primary) hypertension: I10

## 2016-07-07 HISTORY — DX: Generalized anxiety disorder: F41.1

## 2016-07-07 HISTORY — DX: Migraine, unspecified, not intractable, without status migrainosus: G43.909

## 2016-07-07 LAB — URINALYSIS, COMPLETE (UACMP) WITH MICROSCOPIC
Bacteria, UA: NONE SEEN
Bilirubin Urine: NEGATIVE
GLUCOSE, UA: NEGATIVE mg/dL
HGB URINE DIPSTICK: NEGATIVE
Ketones, ur: NEGATIVE mg/dL
Leukocytes, UA: NEGATIVE
Nitrite: NEGATIVE
PH: 8 (ref 5.0–8.0)
Protein, ur: NEGATIVE mg/dL
SPECIFIC GRAVITY, URINE: 1.021 (ref 1.005–1.030)

## 2016-07-07 LAB — COMPREHENSIVE METABOLIC PANEL
ALT: 21 U/L (ref 14–54)
AST: 20 U/L (ref 15–41)
Albumin: 3.7 g/dL (ref 3.5–5.0)
Alkaline Phosphatase: 83 U/L (ref 38–126)
Anion gap: 5 (ref 5–15)
BUN: 12 mg/dL (ref 6–20)
CALCIUM: 8.8 mg/dL — AB (ref 8.9–10.3)
CHLORIDE: 106 mmol/L (ref 101–111)
CO2: 28 mmol/L (ref 22–32)
Creatinine, Ser: 1.01 mg/dL — ABNORMAL HIGH (ref 0.44–1.00)
GFR calc Af Amer: >60 mL/min (ref >60)
Glucose, Bld: 99 mg/dL (ref 65–99)
Potassium: 3.7 mmol/L (ref 3.5–5.1)
Sodium: 139 mmol/L (ref 135–145)
TOTAL PROTEIN: 6.5 g/dL (ref 6.5–8.1)
Total Bilirubin: 1 mg/dL (ref 0.3–1.2)

## 2016-07-07 LAB — CBC
HCT: 35.5 % — ABNORMAL LOW (ref 36.0–46.0)
Hemoglobin: 12.2 g/dL (ref 12.0–15.0)
MCH: 29 pg (ref 26.0–34.0)
MCHC: 34.4 g/dL (ref 30.0–36.0)
MCV: 84.3 fL (ref 78.0–100.0)
PLATELETS: 168 10*3/uL (ref 150–400)
RBC: 4.21 MIL/uL (ref 3.87–5.11)
RDW: 13.5 % (ref 11.5–15.5)
WBC: 6.3 10*3/uL (ref 4.0–10.5)

## 2016-07-07 LAB — APTT: APTT: 28 s (ref 24–36)

## 2016-07-07 LAB — PROTIME-INR
INR: 1.04
PROTHROMBIN TIME: 13.6 s (ref 11.4–15.2)

## 2016-07-07 SURGERY — APPENDECTOMY, LAPAROSCOPIC
Anesthesia: General

## 2016-07-07 MED ORDER — PIPERACILLIN-TAZOBACTAM 3.375 G IVPB 30 MIN
3.3750 g | INTRAVENOUS | Status: AC
  Administered 2016-07-07: 3.375 g via INTRAVENOUS
  Filled 2016-07-07: qty 50

## 2016-07-07 MED ORDER — VENLAFAXINE HCL ER 75 MG PO CP24
75.0000 mg | ORAL_CAPSULE | Freq: Every day | ORAL | Status: DC
  Administered 2016-07-08: 75 mg via ORAL
  Filled 2016-07-07: qty 1

## 2016-07-07 MED ORDER — LEVALBUTEROL TARTRATE 45 MCG/ACT IN AERO: 2.0000 | INHALATION_SPRAY | Freq: Four times a day (QID) | RESPIRATORY_TRACT | Status: DC | PRN

## 2016-07-07 MED ORDER — OXYCODONE HCL 5 MG PO TABS
5.0000 mg | ORAL_TABLET | ORAL | Status: DC | PRN
  Administered 2016-07-07: 10 mg via ORAL
  Administered 2016-07-08: 5 mg via ORAL
  Filled 2016-07-07: qty 2
  Filled 2016-07-07: qty 1

## 2016-07-07 MED ORDER — ACETAMINOPHEN 325 MG PO TABS: 650.0000 mg | ORAL_TABLET | Freq: Four times a day (QID) | ORAL | Status: DC | PRN

## 2016-07-07 MED ORDER — PIPERACILLIN-TAZOBACTAM 3.375 G IVPB
3.3750 g | Freq: Three times a day (TID) | INTRAVENOUS | Status: DC
  Administered 2016-07-07 – 2016-07-08 (×2): 3.375 g via INTRAVENOUS
  Filled 2016-07-07 (×3): qty 50

## 2016-07-07 MED ORDER — LIDOCAINE 2% (20 MG/ML) 5 ML SYRINGE
INTRAMUSCULAR | Status: AC
  Filled 2016-07-07: qty 5

## 2016-07-07 MED ORDER — ROCURONIUM BROMIDE 50 MG/5ML IV SOSY
PREFILLED_SYRINGE | INTRAVENOUS | Status: DC | PRN
  Administered 2016-07-07: 40 mg via INTRAVENOUS
  Administered 2016-07-07: 10 mg via INTRAVENOUS

## 2016-07-07 MED ORDER — MIDAZOLAM HCL 2 MG/2ML IJ SOLN
INTRAMUSCULAR | Status: AC
  Filled 2016-07-07: qty 2

## 2016-07-07 MED ORDER — HYDROMORPHONE HCL 2 MG/ML IJ SOLN
0.5000 mg | INTRAMUSCULAR | Status: DC | PRN
  Administered 2016-07-07: 1 mg via INTRAVENOUS
  Filled 2016-07-07: qty 1

## 2016-07-07 MED ORDER — HYDROMORPHONE HCL 2 MG/ML IJ SOLN
1.0000 mg | INTRAMUSCULAR | Status: DC | PRN
  Administered 2016-07-07 – 2016-07-08 (×3): 1 mg via INTRAVENOUS
  Filled 2016-07-07 (×3): qty 1

## 2016-07-07 MED ORDER — ROCURONIUM BROMIDE 50 MG/5ML IV SOSY
PREFILLED_SYRINGE | INTRAVENOUS | Status: AC
  Filled 2016-07-07: qty 5

## 2016-07-07 MED ORDER — EPHEDRINE SULFATE 50 MG/ML IJ SOLN
INTRAMUSCULAR | Status: DC | PRN
  Administered 2016-07-07 (×2): 10 mg via INTRAVENOUS

## 2016-07-07 MED ORDER — FENTANYL CITRATE (PF) 100 MCG/2ML IJ SOLN
INTRAMUSCULAR | Status: DC | PRN
  Administered 2016-07-07 (×5): 50 ug via INTRAVENOUS

## 2016-07-07 MED ORDER — ONDANSETRON HCL 4 MG/2ML IJ SOLN: 4.0000 mg | Freq: Four times a day (QID) | INTRAMUSCULAR | Status: DC | PRN

## 2016-07-07 MED ORDER — ALBUTEROL SULFATE (2.5 MG/3ML) 0.083% IN NEBU: 2.5000 mg | INHALATION_SOLUTION | RESPIRATORY_TRACT | Status: DC | PRN

## 2016-07-07 MED ORDER — FENTANYL CITRATE (PF) 250 MCG/5ML IJ SOLN
INTRAMUSCULAR | Status: AC
  Filled 2016-07-07: qty 5

## 2016-07-07 MED ORDER — MIDAZOLAM HCL 2 MG/2ML IJ SOLN
1.0000 mg | INTRAMUSCULAR | Status: AC | PRN
  Administered 2016-07-07 (×2): 1 mg via INTRAVENOUS

## 2016-07-07 MED ORDER — SUGAMMADEX SODIUM 200 MG/2ML IV SOLN
INTRAVENOUS | Status: AC
  Filled 2016-07-07: qty 2

## 2016-07-07 MED ORDER — EPHEDRINE 5 MG/ML INJ
INTRAVENOUS | Status: AC
  Filled 2016-07-07: qty 10

## 2016-07-07 MED ORDER — LACTATED RINGERS IV SOLN
INTRAVENOUS | Status: DC
  Administered 2016-07-07: 13:00:00 via INTRAVENOUS
  Administered 2016-07-07: 1000 mL via INTRAVENOUS

## 2016-07-07 MED ORDER — METOCLOPRAMIDE HCL 5 MG/ML IJ SOLN: 10.0000 mg | Freq: Once | INTRAMUSCULAR | Status: DC | PRN

## 2016-07-07 MED ORDER — ONDANSETRON 4 MG PO TBDP: 4.0000 mg | ORAL_TABLET | Freq: Four times a day (QID) | ORAL | Status: DC | PRN

## 2016-07-07 MED ORDER — DEXAMETHASONE SODIUM PHOSPHATE 10 MG/ML IJ SOLN
INTRAMUSCULAR | Status: AC
  Filled 2016-07-07: qty 1

## 2016-07-07 MED ORDER — ATORVASTATIN CALCIUM 20 MG PO TABS
20.0000 mg | ORAL_TABLET | Freq: Every day | ORAL | Status: DC
  Administered 2016-07-07: 20 mg via ORAL
  Filled 2016-07-07: qty 1

## 2016-07-07 MED ORDER — PROPOFOL 10 MG/ML IV BOLUS
INTRAVENOUS | Status: AC
  Filled 2016-07-07: qty 20

## 2016-07-07 MED ORDER — PROPOFOL 10 MG/ML IV BOLUS
INTRAVENOUS | Status: DC | PRN
  Administered 2016-07-07: 150 mg via INTRAVENOUS

## 2016-07-07 MED ORDER — DIPHENHYDRAMINE HCL 25 MG PO CAPS: 25.0000 mg | ORAL_CAPSULE | Freq: Four times a day (QID) | ORAL | Status: DC | PRN

## 2016-07-07 MED ORDER — SUCCINYLCHOLINE CHLORIDE 200 MG/10ML IV SOSY
PREFILLED_SYRINGE | INTRAVENOUS | Status: DC | PRN
  Administered 2016-07-07: 100 mg via INTRAVENOUS

## 2016-07-07 MED ORDER — MIDAZOLAM HCL 5 MG/5ML IJ SOLN
INTRAMUSCULAR | Status: DC | PRN
  Administered 2016-07-07: 2 mg via INTRAVENOUS

## 2016-07-07 MED ORDER — PHENYLEPHRINE 40 MCG/ML (10ML) SYRINGE FOR IV PUSH (FOR BLOOD PRESSURE SUPPORT)
PREFILLED_SYRINGE | INTRAVENOUS | Status: AC
  Filled 2016-07-07: qty 10

## 2016-07-07 MED ORDER — RINGERS IRRIGATION IR SOLN
Status: DC | PRN
  Administered 2016-07-07: 1

## 2016-07-07 MED ORDER — ACETAMINOPHEN 650 MG RE SUPP: 650.0000 mg | Freq: Four times a day (QID) | RECTAL | Status: DC | PRN

## 2016-07-07 MED ORDER — ALBUTEROL SULFATE (2.5 MG/3ML) 0.083% IN NEBU: 2.5000 mg | INHALATION_SOLUTION | Freq: Four times a day (QID) | RESPIRATORY_TRACT | Status: DC | PRN

## 2016-07-07 MED ORDER — HYDROMORPHONE HCL 2 MG/ML IJ SOLN
INTRAMUSCULAR | Status: AC
  Filled 2016-07-07: qty 1

## 2016-07-07 MED ORDER — MORPHINE SULFATE (PF) 4 MG/ML IV SOLN
4.0000 mg | Freq: Once | INTRAVENOUS | Status: AC
  Administered 2016-07-07: 4 mg via INTRAVENOUS
  Filled 2016-07-07: qty 1

## 2016-07-07 MED ORDER — MEPERIDINE HCL 50 MG/ML IJ SOLN: 6.2500 mg | INTRAMUSCULAR | Status: DC | PRN

## 2016-07-07 MED ORDER — DIPHENHYDRAMINE HCL 50 MG/ML IJ SOLN: 25.0000 mg | Freq: Four times a day (QID) | INTRAMUSCULAR | Status: DC | PRN

## 2016-07-07 MED ORDER — VORTIOXETINE HBR 5 MG PO TABS
10.0000 mg | ORAL_TABLET | Freq: Every day | ORAL | Status: DC
  Administered 2016-07-07 – 2016-07-08 (×2): 10 mg via ORAL
  Filled 2016-07-07 (×2): qty 2

## 2016-07-07 MED ORDER — SUGAMMADEX SODIUM 200 MG/2ML IV SOLN
INTRAVENOUS | Status: DC | PRN
  Administered 2016-07-07: 200 mg via INTRAVENOUS

## 2016-07-07 MED ORDER — QUETIAPINE FUMARATE 25 MG PO TABS
25.0000 mg | ORAL_TABLET | Freq: Every day | ORAL | Status: DC
  Administered 2016-07-07: 25 mg via ORAL
  Filled 2016-07-07: qty 1

## 2016-07-07 MED ORDER — LIP MEDEX EX OINT
TOPICAL_OINTMENT | CUTANEOUS | Status: AC
  Filled 2016-07-07: qty 7

## 2016-07-07 MED ORDER — LISINOPRIL 10 MG PO TABS
10.0000 mg | ORAL_TABLET | Freq: Every day | ORAL | Status: DC
  Administered 2016-07-07: 10 mg via ORAL
  Filled 2016-07-07: qty 1

## 2016-07-07 MED ORDER — ONDANSETRON HCL 4 MG/2ML IJ SOLN
4.0000 mg | Freq: Once | INTRAMUSCULAR | Status: AC
  Administered 2016-07-07: 4 mg via INTRAVENOUS
  Filled 2016-07-07: qty 2

## 2016-07-07 MED ORDER — SUCCINYLCHOLINE CHLORIDE 200 MG/10ML IV SOSY
PREFILLED_SYRINGE | INTRAVENOUS | Status: AC
  Filled 2016-07-07: qty 10

## 2016-07-07 MED ORDER — PANTOPRAZOLE SODIUM 40 MG PO TBEC
40.0000 mg | DELAYED_RELEASE_TABLET | Freq: Every day | ORAL | Status: DC
Start: 2016-07-07 — End: 2016-07-07

## 2016-07-07 MED ORDER — ONDANSETRON HCL 4 MG/2ML IJ SOLN
INTRAMUSCULAR | Status: AC
  Filled 2016-07-07: qty 2

## 2016-07-07 MED ORDER — HYDROMORPHONE HCL 1 MG/ML IJ SOLN
0.2500 mg | INTRAMUSCULAR | Status: DC | PRN
  Administered 2016-07-07 (×4): 0.5 mg via INTRAVENOUS

## 2016-07-07 MED ORDER — ONDANSETRON HCL 4 MG/2ML IJ SOLN
INTRAMUSCULAR | Status: DC | PRN
  Administered 2016-07-07: 4 mg via INTRAVENOUS

## 2016-07-07 MED ORDER — LIDOCAINE 2% (20 MG/ML) 5 ML SYRINGE
INTRAMUSCULAR | Status: DC | PRN
  Administered 2016-07-07: 100 mg via INTRAVENOUS

## 2016-07-07 MED ORDER — POTASSIUM CHLORIDE IN NACL 20-0.9 MEQ/L-% IV SOLN
INTRAVENOUS | Status: DC
  Administered 2016-07-07: 10:00:00 via INTRAVENOUS
  Filled 2016-07-07 (×2): qty 1000

## 2016-07-07 MED ORDER — PANTOPRAZOLE SODIUM 40 MG IV SOLR
40.0000 mg | Freq: Every day | INTRAVENOUS | Status: DC
  Administered 2016-07-07: 40 mg via INTRAVENOUS
  Filled 2016-07-07: qty 40

## 2016-07-07 MED ORDER — TOPIRAMATE 25 MG PO TABS
50.0000 mg | ORAL_TABLET | Freq: Every day | ORAL | Status: DC
  Administered 2016-07-07 – 2016-07-08 (×2): 50 mg via ORAL
  Filled 2016-07-07 (×2): qty 2

## 2016-07-07 MED ORDER — CLONAZEPAM 1 MG PO TABS
2.0000 mg | ORAL_TABLET | Freq: Two times a day (BID) | ORAL | Status: DC
  Administered 2016-07-07: 2 mg via ORAL
  Filled 2016-07-07: qty 2

## 2016-07-07 MED ORDER — BUPIVACAINE HCL 0.25 % IJ SOLN
INTRAMUSCULAR | Status: AC
  Filled 2016-07-07: qty 1

## 2016-07-07 MED ORDER — BUPIVACAINE HCL (PF) 0.25 % IJ SOLN
INTRAMUSCULAR | Status: DC | PRN
  Administered 2016-07-07: 20 mL

## 2016-07-07 MED ORDER — DEXAMETHASONE SODIUM PHOSPHATE 10 MG/ML IJ SOLN
INTRAMUSCULAR | Status: DC | PRN
  Administered 2016-07-07: 10 mg via INTRAVENOUS

## 2016-07-07 MED ORDER — MORPHINE SULFATE ER 30 MG PO TBCR
30.0000 mg | EXTENDED_RELEASE_TABLET | Freq: Two times a day (BID) | ORAL | Status: DC
  Administered 2016-07-07: 30 mg via ORAL
  Filled 2016-07-07: qty 1

## 2016-07-07 MED ORDER — MONTELUKAST SODIUM 10 MG PO TABS
10.0000 mg | ORAL_TABLET | Freq: Every day | ORAL | Status: DC
  Administered 2016-07-07: 10 mg via ORAL
  Filled 2016-07-07: qty 1

## 2016-07-07 MED ORDER — KCL IN DEXTROSE-NACL 20-5-0.45 MEQ/L-%-% IV SOLN
INTRAVENOUS | Status: DC
  Administered 2016-07-07: 75 mL/h via INTRAVENOUS
  Filled 2016-07-07 (×2): qty 1000

## 2016-07-07 SURGICAL SUPPLY — 37 items
APL SKNCLS STERI-STRIP NONHPOA (GAUZE/BANDAGES/DRESSINGS) ×1
APPLIER CLIP ROT 10 11.4 M/L (STAPLE)
APR CLP MED LRG 11.4X10 (STAPLE)
BAG SPEC RTRVL LRG 6X4 10 (ENDOMECHANICALS) ×1
BENZOIN TINCTURE PRP APPL 2/3 (GAUZE/BANDAGES/DRESSINGS) ×3 IMPLANT
CATH FOLEY 2WAY SLVR  5CC 12FR (CATHETERS) ×2
CATH FOLEY 2WAY SLVR 5CC 12FR (CATHETERS) IMPLANT
CHLORAPREP W/TINT 26ML (MISCELLANEOUS) ×3 IMPLANT
CLIP APPLIE ROT 10 11.4 M/L (STAPLE) IMPLANT
CLOSURE WOUND 1/2 X4 (GAUZE/BANDAGES/DRESSINGS) ×1
COVER SURGICAL LIGHT HANDLE (MISCELLANEOUS) ×3 IMPLANT
CUTTER FLEX LINEAR 45M (STAPLE) ×2 IMPLANT
DECANTER SPIKE VIAL GLASS SM (MISCELLANEOUS) ×3 IMPLANT
DRAPE LAPAROSCOPIC ABDOMINAL (DRAPES) ×3 IMPLANT
ELECT REM PT RETURN 9FT ADLT (ELECTROSURGICAL) ×3
ELECTRODE REM PT RTRN 9FT ADLT (ELECTROSURGICAL) ×1 IMPLANT
ENDOLOOP SUT PDS II  0 18 (SUTURE)
ENDOLOOP SUT PDS II 0 18 (SUTURE) IMPLANT
GLOVE SURG ORTHO 8.0 STRL STRW (GLOVE) ×3 IMPLANT
GOWN STRL REUS W/TWL XL LVL3 (GOWN DISPOSABLE) ×6 IMPLANT
IRRIG SUCT STRYKERFLOW 2 WTIP (MISCELLANEOUS) ×3
IRRIGATION SUCT STRKRFLW 2 WTP (MISCELLANEOUS) ×1 IMPLANT
KIT BASIN OR (CUSTOM PROCEDURE TRAY) ×3 IMPLANT
POUCH SPECIMEN RETRIEVAL 10MM (ENDOMECHANICALS) ×3 IMPLANT
RELOAD 45 VASCULAR/THIN (ENDOMECHANICALS) IMPLANT
RELOAD STAPLE 45 2.5 WHT GRN (ENDOMECHANICALS) IMPLANT
RELOAD STAPLE 45 3.5 BLU ETS (ENDOMECHANICALS) IMPLANT
RELOAD STAPLE TA45 3.5 REG BLU (ENDOMECHANICALS) ×3 IMPLANT
SHEARS HARMONIC ACE PLUS 36CM (ENDOMECHANICALS) ×3 IMPLANT
STRIP CLOSURE SKIN 1/2X4 (GAUZE/BANDAGES/DRESSINGS) ×2 IMPLANT
SUT MNCRL AB 4-0 PS2 18 (SUTURE) ×3 IMPLANT
TOWEL OR 17X26 10 PK STRL BLUE (TOWEL DISPOSABLE) ×3 IMPLANT
TOWEL OR NON WOVEN STRL DISP B (DISPOSABLE) ×3 IMPLANT
TRAY FOLEY W/METER SILVER 14FR (SET/KITS/TRAYS/PACK) ×2 IMPLANT
TRAY LAPAROSCOPIC (CUSTOM PROCEDURE TRAY) ×3 IMPLANT
TROCAR XCEL BLUNT TIP 100MML (ENDOMECHANICALS) ×3 IMPLANT
TROCAR XCEL NON-BLD 11X100MML (ENDOMECHANICALS) ×3 IMPLANT

## 2016-07-07 NOTE — ED Triage Notes (Signed)
Pt presents from [facility] after being dx with an acute appendicitis. Pt is reportedly to be seen by Dr. Hassell Done in the morning for surgery. Pt has received zosyn. Alert and oriented.

## 2016-07-07 NOTE — Anesthesia Postprocedure Evaluation (Addendum)
Anesthesia Post Note  Patient: BRANDLYN PAOLO  Procedure(s) Performed: Procedure(s) (LRB): APPENDECTOMY LAPAROSCOPIC (N/A)  Patient location during evaluation: PACU Anesthesia Type: General Level of consciousness: awake and alert and oriented Pain management: pain level controlled Vital Signs Assessment: post-procedure vital signs reviewed and stable Respiratory status: spontaneous breathing, nonlabored ventilation, respiratory function stable and patient connected to nasal cannula oxygen Cardiovascular status: blood pressure returned to baseline and stable Postop Assessment: no signs of nausea or vomiting Anesthetic complications: no       Last Vitals:  Vitals:   07/07/16 1330 07/07/16 1345  BP: (!) 162/75 (!) 143/71  Pulse: 63 69  Resp: 17 13  Temp:      Last Pain:  Vitals:   07/07/16 1322  TempSrc:   PainSc: 10-Worst pain ever                 Alayjah Boehringer A.

## 2016-07-07 NOTE — H&P (Signed)
Reason for Consult:  Acute appendicitis Referring Physician: Learta Codding, MD [facility] ED PCP: Bonnita Nasuti, MD Dr. Posey Rea - [facility] Family [facility] PCP Psychiatry: Dr. Chaya Jan - [place]     Amber Harrell is an 56 y.o. female.  Chief Complaint  Patient presents with  . Abdominal Pain - she reports pain since 06/28/16 when she saw Dr. Posey Rea.  She see's him monthly for chronic pain.  She reports nausea with eating, but not weight loss.  Pain also with voiding, and after voiding.  Pain has become worse and she presented to the ED at [facility]. Work up from [place] is below.  She was eventually transferred to [facility] for evaluation and treatment.   HPI: 56 y/o female patient with past medical history of asthma, pancreatitis, acid reflux, anxiety,Constipation, kidney stones and chronic pain presents to the ED with lower right abdominal pain and right flank pain with radiation to her back. Pain has been present and gradually worsening for 1 week. She has had low grade fever (45F) and chills. Her last normal BM was 4 days ago. She also has had some vomiting, most recently 3 days ago. She has had decreased appetite. Patient states that she had a small amount of blood in her urine. Patient was seen at urgent care in [place] today and did a X-ray. The provider, Dr. Owens Shark was concerned about appendicitis and advised that she be seen in the ED.  12:19 AM phone call placed to [facility] per patient request.  12:29 AM d/w Dr. Georgette Dover. Declined the patient as he says he is not on call for [place].  12:32 AM called [facility], they are in ER divert. 12:36 AM informed patient and having difficulty transferring the patient probably because of the influenza epidemic, she requested [facility] long. I placed another call to [facility]. 12:39 AM Dr. Hassell Done. Says to send her to [facility] ER at 06:00.  12:46 AM d/w Dr. Stark Jock. [place] ER, accepted patient.     Work up shows she is afebrile vital signs are  stable. Labs and work up are from [place].     ED Clinical Impression  At [facility]  Final diagnoses:  Acute appendicitis with localized peritonitis (Primary)     Past Medical History:  Diagnosis Date  . Acid reflux (RAF-HCC)  . Anxiety and depression  . Asthma (RAF-HCC)  . Chronic pain  . Constipated  . Hyperlipidemia (RAF-HCC)  . Kidney stones  . S/P cholecystectomy  . Sleep apnea   Past Surgical History:  Procedure Laterality Date  . CHOLECYSTECTOMY  . ELBOW SURGERY  . HYSTERECTOMY  . neck fusion 2008  ACDF C56  . SHOULDER SURGERY Bilateral  . TUBAL LIGATION   Family History  Problem Relation Age of Onset  . Cancer Mother  . Diabetes Mother  . Hypertension Mother  . Heart disease Mother  . COPD Mother  . Hypertension Sister  . Cancer Sister   Social History   Social History  . Marital status: Married  Spouse name: N/A  . Number of children: N/A  . Years of education: N/A   Social History Main Topics  . Smoking status: Former Research scientist (life sciences)  . Smokeless tobacco: Never Used  . Alcohol use No  . Drug use: No  . Sexual activity: Not Asked   Other Topics Concern  . None   Social History Narrative  . None   Review of Systems  Constitutional: Positive for chills and fever.  HENT: Negative for congestion, rhinorrhea and sore throat.  Respiratory: Negative for shortness of breath.  Cardiovascular: Negative for chest pain.  Gastrointestinal: Positive for abdominal distention, abdominal pain, nausea and vomiting.  Genitourinary: Positive for frequency and hematuria. Negative for vaginal discharge.  Vaginal odor, but has been ongoing.  Musculoskeletal: Positive for back pain and neck pain.  Chronic  Skin: Negative for rash.  Neurological: Positive for headaches (has long h/o same).  All other systems reviewed and are negative.  Physical Exam   BP 138/75  Pulse 66  Temp 37.3 C (99.2 F) (Oral)  Resp 16  Ht 157.5 cm (5\' 2" )  Wt 75.3 kg (166 lb)   SpO2 96%  BMI 30.36 kg/m   Physical Exam  Constitutional: She is oriented to person, place, and time. She appears well-developed and well-nourished. No distress.  HENT:  Mouth/Throat: Oropharynx is clear and moist.  Oropharynx is dry.  Eyes: Conjunctivae are normal.  Neck: Normal range of motion. Neck supple.  Cardiovascular: Normal rate, regular rhythm, normal heart sounds and intact distal pulses.  Pulmonary/Chest: Effort normal and breath sounds normal.  Abdominal: Soft. Bowel sounds are normal. There is no rebound.  Tender to percussion, not tympanic. Localized rebound tenderness, worst in RLQ. Right upper and lower quadrant tenderness is worst. Negative Rovsing's sign.  Musculoskeletal: Normal range of motion. She exhibits no edema.  Neurological: She is alert and oriented to person, place, and time.  Skin: Skin is warm and dry.  Psychiatric: She has a normal mood and affect. Her behavior is normal. Judgment and thought content normal.  Vitals reviewed.  ED Course  Patient with abdominal pain. Will order labs CT scan. Given that the urinalysis has no hematuria, we'll do a CT with IV and oral contrast. We'll give fentanyl. 12:19 AM phone call placed to [facility] per patient request.  12:29 AM d/w Dr. Georgette Dover. Declined the patient as he says he is not on call for [place].  12:32 AM called [facility], they are in ER divert. 12:36 AM informed patient and having difficulty transferring the patient probably because of the influenza epidemic, she requested [facility] long. I placed another call to [place]. 12:39 AM Dr. Hassell Done. Says to send her to [facility] ER at 06:00.  12:46 AM d/w Dr. Stark Jock. [place] ER, accepted patient.  Ct Abdomen Pelvis W Contrast  Result Date: 07/07/2016 CLINICAL DATA: Right lower quadrant pain EXAM: CT ABDOMEN AND PELVIS WITH CONTRAST TECHNIQUE: Multidetector CT imaging of the abdomen and pelvis was performed using the standard protocol following bolus  administration of intravenous contrast. CONTRAST: 100 cc of Omnipaque 300 IV with dilute Omnipaque 240 given orally COMPARISON: None. FINDINGS: LOWER CHEST: Lung bases are clear. Included heart size is normal. No pericardial effusion. HEPATOBILIARY: Liver is unremarkable. Cholecystectomy. PANCREAS: Mild pancreatic atrophy. SPLEEN: Normal. ADRENALS/URINARY TRACT: Kidneys are orthotopic, demonstrating symmetric enhancement. No nephrolithiasis, hydronephrosis or solid renal masses. The unopacified ureters are normal in course and caliber. Delayed imaging through the kidneys demonstrates symmetric prompt contrast excretion within the proximal urinary collecting system. Urinary bladder is partially distended and unremarkable. Normal adrenal glands. STOMACH/BOWEL: The stomach, small and large bowel are normal in course and caliber without inflammatory changes. Abnormal inflamed and distended appearing retrocecal appendix measuring up to 9 mm in diameter. No perforation or abscess. There is adjacent mesenteric fatty inflammation. VASCULAR/LYMPHATIC: Aortoiliac vessels are normal in course and caliber. No lymphadenopathy by CT size criteria. REPRODUCTIVE: Hysterectomy and bilateral tubal ligations. OTHER: No intraperitoneal free  fluid or free air. MUSCULOSKELETAL: Nonacute.   Acute appendicitis without complication, retrocecal in position measuring up to 9 mm. Electronically Signed By: Ashley Royalty M.D. On: 07/07/2016 00:01   Results for orders placed or performed during the hospital encounter of 07/06/16  Comprehensive Metabolic Panel  Result Value Ref Range  Sodium 145 135 - 145 mmol/L  Potassium 3.3 (L) 3.5 - 5.0 mmol/L  Chloride 104 98 - 107 mmol/L  CO2 32.0 22.0 - 32.0 mmol/L  BUN 12 7 - 21 mg/dL  Creatinine 0.90 0.60 - 1.00 mg/dL  BUN/Creatinine Ratio 13  GFR MDRD Non Af Amer >60 mL/min/1.34m2  GFR MDRD Af Amer >60 60 - 999 mL/min/1.90m2  Anion Gap 9 mmol/L  Glucose 95 74 - 106 mg/dL  Calcium 9.3 8.5 -  10.2 mg/dL  Albumin 4.5 3.4 - 5.0 g/dL  Total Protein 7.8 6.6 - 8.0 g/dL  Total Bilirubin 0.7 0.1 - 1.2 mg/dL  AST 30 14 - 38 U/L  ALT 39 15 - 48 U/L  Alkaline Phosphatase 104 38 - 126 U/L  Lipase Level  Result Value Ref Range  Lipase 90 44 - 232 U/L  Urinalysis  Result Value Ref Range  Color, UA Yellow  Clarity, UA Hazy  pH, UA 7.0 5.0 - 7.0  Leukocyte Esterase, UA Negative Negative  Nitrite, UA Negative Negative  Protein, UA Trace (A) Negative  Glucose, UA Negative Negative  Bilirubin, UA Small (A) Negative  RBC, UA 0 0 - 3 /HPF  WBC, UA 0 0 - 3 /HPF  Squam Epithel, UA 5 0 - 5 /HPF  Bacteria, UA Few (A) None Seen /HPF  Specific Gravity, UA 1.025 1.005 - 1.030  Ketones, UA Negative Negative  Urobilinogen, UA 1.0 mg/dL (A) 0.2 mg/dL  Blood, UA Negative Negative  Mucus, UA Many (A) None Seen /HPF  CBC w/ Differential  Result Value Ref Range  WBC 7.6 3.4 - 10.8 10*9/L  RBC 4.83 3.77 - 5.28 10*12/L  HGB 13.7 11.1 - 15.9 g/dL  HCT 41.5 34.0 - 46.6 %  MCV 85.9 79.0 - 97.0 fL  MCH 28.4 27.0 - 33.0 pg  MCHC 33.0 31.5 - 35.7 g/dL  RDW 13.9 12.3 - 15.4 %  MPV 10.1 9.0 - 12.0 fL  Platelet 207 155 - 379 10*9/L    CT scan: CONTRAST TECHNIQUE: Multidetector CT imaging of the abdomen and pelvis was performed using the standard protocol following bolus administration of intravenous contrast. CONTRAST: 100 cc of Omnipaque 300 IV with dilute Omnipaque 240 given orally COMPARISON: None. FINDINGS: LOWER CHEST: Lung bases are clear. Included heart size is normal. No pericardial effusion. HEPATOBILIARY: Liver is unremarkable. Cholecystectomy. PANCREAS: Mild pancreatic atrophy. SPLEEN: Normal. ADRENALS/URINARY TRACT: Kidneys are orthotopic, demonstrating symmetric enhancement. No nephrolithiasis, hydronephrosis or solid renal masses. The unopacified ureters are normal in course and caliber. Delayed imaging through the kidneys demonstrates symmetric prompt contrast excretion within the proximal  urinary collecting system. Urinary bladder is partially distended and unremarkable. Normal adrenal glands. STOMACH/BOWEL: The stomach, small and large bowel are normal in course and caliber without inflammatory changes. Abnormal inflamed and distended appearing retrocecal appendix measuring up to 9 mm in diameter. No perforation or abscess. There is adjacent mesenteric fatty inflammation. VASCULAR/LYMPHATIC: Aortoiliac vessels are normal in course and caliber. No lymphadenopathy by CT size criteria. REPRODUCTIVE: Hysterectomy and bilateral tubal ligations. OTHER: No intraperitoneal free fluid or free air. MUSCULOSKELETAL: Nonacute. Acute appendicitis without complication, retrocecal in position measuring up to 9 mm  Past Medical History:  Diagnosis Date  . 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 Followed by Dr. Wilfrid Lund     . Anxiety    . Arthritis    . Asthma    . Chronic headaches    . Colon polyps    . Depression    . Diverticulosis    . Fibromyalgia    . GERD (gastroesophageal reflux disease)    . Hyperlipemia    . Hypertension    . IBS (irritable bowel syndrome)    . Osteoporosis    . Sleep apnea      wears c-pap           Past Surgical History:  Procedure Laterality Date  . ABDOMINAL HYSTERECTOMY      . cervical neck fusion      . CHOLECYSTECTOMY   2003  . csc      . ELBOW SURGERY Right    . SHOULDER SURGERY Right    . SHOULDER SURGERY Left    . uterous procedure        "burned cells in uterous" per pt            Family History  Problem Relation Age of Onset  . Colon cancer Mother        63's  . Colon polyps Mother    . Diabetes Mother    . Heart disease Mother    . Pancreatic cancer Mother    . Irritable bowel syndrome Sister    . Renal Disease Sister    . Heart disease Father    . Kidney cancer Sister    . Breast cancer Cousin        1st cousin maternal  . Pancreatic cancer Cousin        1st cousin maternal  . Esophageal  cancer Neg Hx    . Rectal cancer Neg Hx    . Stomach cancer Neg Hx        Social History:  reports that she quit smoking about 26 years ago. Her smoking use included Cigarettes. She has a 60.00 pack-year smoking history. She has never used smokeless tobacco. She reports that she does not drink alcohol or use drugs. Tobacco:  60 pack year hx quit some years ago ETOH:  None DRUGS:  None Married lives with family On disability since 2003 Allergies:       Allergies  Allergen Reactions  . Macrobid [Nitrofurantoin] Anaphylaxis  . Paxil [Paroxetine Hcl] Nausea And Vomiting  . Avelox [Moxifloxacin Hcl In Nacl] Nausea Only  . Erythromycin Nausea Only  . Levaquin [Levofloxacin] Nausea Only  . Ultram [Tramadol] Hives      ER (extended release) only.  Can tolerate immediate release.  . Vicodin [Hydrocodone-Acetaminophen] Other (See Comments)      Increases anxiety           Prior to Admission medications   Medication Sig Start Date End Date Taking? Authorizing Provider  albuterol (PROVENTIL) (2.5 MG/3ML) 0.083% nebulizer solution Take 2.5 mg by nebulization every 6 (six) hours as needed for wheezing or shortness of breath.     Yes Historical Provider, MD  atorvastatin (LIPITOR) 20 MG tablet Take 20 mg by mouth daily.     Yes Historical Provider, MD  clonazePAM (KLONOPIN) 2 MG tablet Take 2 mg by mouth 2 (two) times daily.     Yes Historical Provider, MD  cyclobenzaprine (FLEXERIL) 10 MG tablet Take 1 tablet by mouth 2 (two) times daily. 07/06/16  Yes Historical Provider, MD  dexlansoprazole (DEXILANT) 60 MG capsule Take 60 mg by mouth 2 (two) times daily at 10 AM and 5 PM.     Yes Historical Provider, MD  dicyclomine (BENTYL) 20 MG tablet Take 20 mg by mouth every 6 (six) hours.     Yes Historical Provider, MD  levalbuterol [facility] HFA) 45 MCG/ACT inhaler Inhale 2 puffs into the lungs every 6 (six) hours as needed for wheezing.     Yes Historical Provider, MD  linaclotide (LINZESS) 290 MCG  CAPS capsule Take 290 mcg by mouth daily before breakfast.     Yes Historical Provider, MD  lisinopril (PRINIVIL,ZESTRIL) 10 MG tablet Take 10 mg by mouth daily.     Yes Historical Provider, MD  montelukast (SINGULAIR) 10 MG tablet Take 10 mg by mouth at bedtime.     Yes Historical Provider, MD  morphine (MS CONTIN) 30 MG 12 hr tablet Take 30 mg by mouth every 12 (twelve) hours.     Yes Historical Provider, MD  Olopatadine HCl (PATADAY) 0.2 % SOLN Apply to eye.     Yes Historical Provider, MD  ondansetron (ZOFRAN) 8 MG tablet Take by mouth every 8 (eight) hours as needed for nausea or vomiting.     Yes Historical Provider, MD  polyethylene glycol (MIRALAX / GLYCOLAX) packet Take 17 g by mouth daily.     Yes Historical Provider, MD  QUEtiapine (SEROQUEL) 50 MG tablet Take 25 mg by mouth at bedtime.     Yes Historical Provider, MD  sucralfate (CARAFATE) 1 GM/10ML suspension Take 1 g by mouth 2 (two) times daily.     Yes Historical Provider, MD  topiramate (TOPAMAX) 50 MG tablet Take 50 mg by mouth daily.     Yes Historical Provider, MD  Vortioxetine HBr (BRINTELLIX) 10 MG TABS Take 1 tablet by mouth daily.      Yes Historical Provider, MD  desvenlafaxine (PRISTIQ) 50 MG 24 hr tablet Take 1 tablet (50 mg total) by mouth daily. Patient not taking: Reported on 05/19/2016 03/15/12     Patrick Jupiter, PA-C        Lab Results Last 48 Hours  No results found for this or any previous visit (from the past 60 hour(s)).     Imaging Results (Last 48 hours)  No results found.     Review of Systems  Constitutional: Positive for fever, malaise/fatigue and weight loss.  HENT: Negative.   Eyes: Negative.   Respiratory: Positive for shortness of breath (DOE) and wheezing (uses inhalers PRN).        Hx of sleep apnea, uses CPAP when not in pain  Cardiovascular: Positive for orthopnea, leg swelling and PND.  Gastrointestinal: Positive for abdominal pain (pain on right more than left, worst in the RLQ), blood in  stool (Hx of opoid constipation with some bleeding from this), constipation (last BM 4 days ago), heartburn, nausea and vomiting (occasional). Negative for diarrhea.  Genitourinary: Positive for dysuria (she has pain with needing to void, and after voiding), frequency and urgency.  Musculoskeletal: Positive for back pain, joint pain, myalgias and neck pain.       She has fibromyalgia, pain all over.  Worse with current pain, and walking.  Pain in back, neck, right leg and feet walking.  Skin: Negative.   Neurological: Positive for dizziness and headaches (she has migraines and take Topamax BID for this).  Endo/Heme/Allergies: Bruises/bleeds easily.  Psychiatric/Behavioral: Positive for depression. The patient is nervous/anxious and has  insomnia.     Blood pressure 143/81, pulse (!) 59, temperature 97.7 F (36.5 C), temperature source Oral, resp. rate 18, height 5\' 2"  (1.575 m), weight 75.3 kg (166 lb), SpO2 98 %. Physical Exam   Assessment/Plan: Abdominal pain with appendicitis COPD/Asthma 60 year hx of tobacco use Sleep apnea with CPAP Chronic pain/Fibromyalgia - MS contin BID at home Chronic opoid induced constipation - Dr. Madelon Lips GI Migraines Hypertension Anxiety/depression - Psychiatry follows Multiple allergies Multiple Medications at home GERD Dyslipidemia On disability since 2003   Plan:  Admit and plan appendectomy later today.  Ask Medicine to see and help with medical issues.  Rechecking labs so we have a baseline here in the hospital.       Earnstine Regal 07/07/2016, 7:14 AM

## 2016-07-07 NOTE — ED Notes (Signed)
Patient has been wiped down for OR.

## 2016-07-07 NOTE — Anesthesia Preprocedure Evaluation (Addendum)
Anesthesia Evaluation  Patient identified by MRN, date of birth, ID band Patient awake    Reviewed: Allergy & Precautions, NPO status , Patient's Chart, lab work & pertinent test results  Airway Mallampati: II  TM Distance: >3 FB Neck ROM: Full    Dental  (+) Partial Upper, Missing,    Pulmonary asthma , sleep apnea and Continuous Positive Airway Pressure Ventilation , former smoker,    Pulmonary exam normal breath sounds clear to auscultation       Cardiovascular hypertension, Pt. on medications Normal cardiovascular exam Rhythm:Regular Rate:Normal     Neuro/Psych  Headaches, PSYCHIATRIC DISORDERS Anxiety Depression Agoraphobia  Panic attacks   GI/Hepatic GERD  Medicated and Controlled,Acute appendicitis Diverticulosis IBS   Endo/Other  Hyperlipidemia   Renal/GU   negative genitourinary   Musculoskeletal  (+) Arthritis , Osteoarthritis,  Fibromyalgia -, narcotic dependentOsteoporosis   Abdominal (+)  Abdomen: tender.    Peds  Hematology   Anesthesia Other Findings   Reproductive/Obstetrics                            Lab Results  Component Value Date   WBC 6.3 07/07/2016   HGB 12.2 07/07/2016   HCT 35.5 (L) 07/07/2016   MCV 84.3 07/07/2016   PLT 168 07/07/2016     Chemistry      Component Value Date/Time   NA 139 07/07/2016 0906   K 3.7 07/07/2016 0906   CL 106 07/07/2016 0906   CO2 28 07/07/2016 0906   BUN 12 07/07/2016 0906   CREATININE 1.01 (H) 07/07/2016 0906      Component Value Date/Time   CALCIUM 8.8 (L) 07/07/2016 0906   ALKPHOS 83 07/07/2016 0906   AST 20 07/07/2016 0906   ALT 21 07/07/2016 0906   BILITOT 1.0 07/07/2016 0906      Anesthesia Physical Anesthesia Plan  ASA: II and emergent  Anesthesia Plan: General   Post-op Pain Management:    Induction: Intravenous  Airway Management Planned: Oral ETT  Additional Equipment:   Intra-op Plan:    Post-operative Plan: Extubation in OR  Informed Consent: I have reviewed the patients History and Physical, chart, labs and discussed the procedure including the risks, benefits and alternatives for the proposed anesthesia with the patient or authorized representative who has indicated his/her understanding and acceptance.   Dental advisory given  Plan Discussed with: Anesthesiologist, CRNA and Surgeon  Anesthesia Plan Comments:         Anesthesia Quick Evaluation

## 2016-07-07 NOTE — Op Note (Signed)
OPERATIVE REPORT - LAPAROSCOPIC APPENDECTOMY  Preop diagnosis: Acute retrocecal appendicitis  Postop diagnosis: Same  Procedure: Laparoscopic appendectomy  Surgeon:  Earnstine Regal, MD, FACS  Anesthesia: General endotracheal  Estimated blood loss: Minimal  Preparation: Chlora-prep  Complications: None  Indications:  Patient presents with  .Abdominal Pain - she reports pain since 06/28/16 when she saw Dr. Posey Rea. She see's him monthly for chronic pain. She reports nausea with eating, but not weight loss. Pain also with voiding, and after voiding. Pain has become worse and she presented to the ED at [facility]. Work up from [place] is below. She was eventually transferred to [facility] for evaluation and treatment.  HPI: 56 y/o female patient with past medical history of asthma, pancreatitis, acid reflux, anxiety,Constipation, kidney stones and chronic pain presents to the ED with lower right abdominal pain and right flank pain with radiation to her back. Pain has been present and gradually worsening for 1 week. She has had low grade fever (78F) and chills. Her last normal BM was 4 days ago. She also has had some vomiting, most recently 3 days ago. She has had decreased appetite. Patient states that she had a small amount of blood in her urine. Patient was seen at urgent care in [place] today and did a X-ray. The provider, Dr. Owens Shark was concerned about appendicitis and advised that she be seen in the ED.  12:19 AM phone call placed to [facility] per patient request.  Procedure:  Patient is brought to the operating room and placed in a supine position on the operating room table. Following administration of general anesthesia, a time out was held and the patient's name and procedure is confirmed. Patient is then prepped and draped in the usual strict aseptic fashion.  After ascertaining that an adequate level of anesthesia has been achieved, a peri-umbilical incision is made with a #15  blade. Dissection is carried down to the fascia. Fascia is incised in the midline and the peritoneal cavity is entered cautiously. A #0-vicryl pursestring suture is placed in the fascia. An Hassan cannula is introduced under direct vision and secured with the pursestring suture. The abdomen is insufflated with carbon dioxide. The laparoscope is introduced and the abdomen is explored. Operative ports are placed in the right upper quadrant and left lower quadrant. The appendix is identified. The mesoappendix is divided with the harmonic scalpel. Dissection is carried down to the base of the appendix. The base of the appendix is dissected out clearing the junction with the cecal wall. Using an Endo-GIA stapler, the base of the appendix is transected at the junction with the cecal wall. There is good approximation of tissue along the staple line. There is good hemostasis along the staple line. The appendix is placed into an endo-catch bag and withdrawn through the umbilical port. The #0-vicryl pursestring suture is tied securely.  Right lower quadrant is irrigated with warm saline which is evacuated. Good hemostasis is noted. Ports are removed under direct vision. Good hemostasis is noted at the port sites. Pneumoperitoneum is released.  Skin incisions are anesthetized with local anesthetic. Wounds are closed with interrupted 4-0 Monocryl subcuticular sutures. Wounds are washed and dried and benzoin and Steri-Strips are applied. Dressings are applied. The patient is awakened from anesthesia and brought to the recovery room. The patient tolerated the procedure well.  Earnstine Regal, MD, [facility] Office: [phone]
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
## 2016-07-07 NOTE — ED Notes (Signed)
PA at bedside.

## 2016-07-07 NOTE — Consult Note (Signed)
Reason for Consult:  Acute appendicitis Referring Physician: Learta Codding, MD [facility] ED PCP: Bonnita Nasuti, MD Dr. Posey Rea - [facility] PCP Psychiatry: Dr. Chaya Jan - [place]   Amber Harrell is an 56 y.o. female.  Chief Complaint  Patient presents with  . Abdominal Pain - she reports pain since 06/28/16 when she saw Dr. Posey Rea.  She see's him monthly for chronic pain.  She reports nausea with eating, but not weight loss.  Pain also with voiding, and after voiding.  Pain has become worse and she presented to the ED at [facility]. Work up from [place] is below.  She was eventually transferred to [facility] for evaluation and treatment.  HPI: 56 y/o female patient with past medical history of asthma, pancreatitis, acid reflux, anxiety,Constipation, kidney stones and chronic pain presents to the ED with lower right abdominal pain and right flank pain with radiation to her back. Pain has been present and gradually worsening for 1 week. She has had low grade fever (55F) and chills. Her last normal BM was 4 days ago. She also has had some vomiting, most recently 3 days ago. She has had decreased appetite. Patient states that she had a small amount of blood in her urine. Patient was seen at urgent care in [place] today and did a X-ray. The provider, Dr. Owens Shark was concerned about appendicitis and advised that she be seen in the ED.  12:19 AM phone call placed to [facility] per patient request.  12:29 AM d/w Dr. Georgette Dover. Declined the patient as he says he is not on call for [place].  12:32 AM called [facility], they are in ER divert. 12:36 AM informed patient and having difficulty transferring the patient probably because of the influenza epidemic, she requested [facility] long. I placed another call to [place]. 12:39 AM Dr. Hassell Done. Says to send her to [facility] ER at 06:00.  12:46 AM d/w Dr. Stark Jock. [place] ER, accepted patient.   Work up shows she is afebrile vital signs are stable.  Labs and work up are from [place].   ED Clinical Impression  At [facility]  Final diagnoses:  Acute appendicitis with localized peritonitis (Primary)     Past Medical History:  Diagnosis Date  . Acid reflux (RAF-HCC)  . Anxiety and depression  . Asthma ([facility]-HCC)  . Chronic pain  . Constipated  . Hyperlipidemia ([facility]-HCC)  . Kidney stones  . S/P cholecystectomy  . Sleep apnea   Past Surgical History:  Procedure Laterality Date  . CHOLECYSTECTOMY  . ELBOW SURGERY  . HYSTERECTOMY  . neck fusion 2008  ACDF C56  . SHOULDER SURGERY Bilateral  . TUBAL LIGATION   Family History  Problem Relation Age of Onset  . Cancer Mother  . Diabetes Mother  . Hypertension Mother  . Heart disease Mother  . COPD Mother  . Hypertension Sister  . Cancer Sister   Social History   Social History  . Marital status: Married  Spouse name: N/A  . Number of children: N/A  . Years of education: N/A   Social History Main Topics  . Smoking status: Former Research scientist (life sciences)  . Smokeless tobacco: Never Used  . Alcohol use No  . Drug use: No  . Sexual activity: Not Asked   Other Topics Concern  . None   Social History Narrative  . None   Review of Systems  Constitutional: Positive for chills and fever.  HENT: Negative for congestion, rhinorrhea and sore  throat.  Respiratory: Negative for shortness of breath.  Cardiovascular: Negative for chest pain.  Gastrointestinal: Positive for abdominal distention, abdominal pain, nausea and vomiting.  Genitourinary: Positive for frequency and hematuria. Negative for vaginal discharge.  Vaginal odor, but has been ongoing.  Musculoskeletal: Positive for back pain and neck pain.  Chronic  Skin: Negative for rash.  Neurological: Positive for headaches (has long h/o same).  All other systems reviewed and are negative.  Physical Exam   BP 138/75  Pulse 66  Temp 37.3 C (99.2 F) (Oral)  Resp 16  Ht 157.5 cm (5\' 2" )  Wt 75.3 kg (166 lb)  SpO2  96%  BMI 30.36 kg/m   Physical Exam  Constitutional: She is oriented to person, place, and time. She appears well-developed and well-nourished. No distress.  HENT:  Mouth/Throat: Oropharynx is clear and moist.  Oropharynx is dry.  Eyes: Conjunctivae are normal.  Neck: Normal range of motion. Neck supple.  Cardiovascular: Normal rate, regular rhythm, normal heart sounds and intact distal pulses.  Pulmonary/Chest: Effort normal and breath sounds normal.  Abdominal: Soft. Bowel sounds are normal. There is no rebound.  Tender to percussion, not tympanic. Localized rebound tenderness, worst in RLQ. Right upper and lower quadrant tenderness is worst. Negative Rovsing's sign.  Musculoskeletal: Normal range of motion. She exhibits no edema.  Neurological: She is alert and oriented to person, place, and time.  Skin: Skin is warm and dry.  Psychiatric: She has a normal mood and affect. Her behavior is normal. Judgment and thought content normal.  Vitals reviewed.  ED Course  Patient with abdominal pain. Will order labs CT scan. Given that the urinalysis has no hematuria, we'll do a CT with IV and oral contrast. We'll give fentanyl. 12:19 AM phone call placed to [facility] per patient request.  12:29 AM d/w Dr. Georgette Dover. Declined the patient as he says he is not on call for [place].  12:32 AM called [facility], they are in ER divert. 12:36 AM informed patient and having difficulty transferring the patient probably because of the influenza epidemic, she requested [facility] long. I placed another call to [place]. 12:39 AM Dr. Hassell Done. Says to send her to [facility] ER at 06:00.  12:46 AM d/w Dr. Stark Jock. [place] ER, accepted patient.  Ct Abdomen Pelvis W Contrast  Result Date: 07/07/2016 CLINICAL DATA: Right lower quadrant pain EXAM: CT ABDOMEN AND PELVIS WITH CONTRAST TECHNIQUE: Multidetector CT imaging of the abdomen and pelvis was performed using the standard protocol following bolus administration  of intravenous contrast. CONTRAST: 100 cc of Omnipaque 300 IV with dilute Omnipaque 240 given orally COMPARISON: None. FINDINGS: LOWER CHEST: Lung bases are clear. Included heart size is normal. No pericardial effusion. HEPATOBILIARY: Liver is unremarkable. Cholecystectomy. PANCREAS: Mild pancreatic atrophy. SPLEEN: Normal. ADRENALS/URINARY TRACT: Kidneys are orthotopic, demonstrating symmetric enhancement. No nephrolithiasis, hydronephrosis or solid renal masses. The unopacified ureters are normal in course and caliber. Delayed imaging through the kidneys demonstrates symmetric prompt contrast excretion within the proximal urinary collecting system. Urinary bladder is partially distended and unremarkable. Normal adrenal glands. STOMACH/BOWEL: The stomach, small and large bowel are normal in course and caliber without inflammatory changes. Abnormal inflamed and distended appearing retrocecal appendix measuring up to 9 mm in diameter. No perforation or abscess. There is adjacent mesenteric fatty inflammation. VASCULAR/LYMPHATIC: Aortoiliac vessels are normal in course and caliber. No lymphadenopathy by CT size criteria. REPRODUCTIVE: Hysterectomy and bilateral tubal ligations. OTHER: No intraperitoneal free fluid or free air. MUSCULOSKELETAL: Nonacute.  Acute appendicitis without complication, retrocecal in position measuring up to 9 mm. Electronically Signed By: Ashley Royalty M.D. On: 07/07/2016 00:01   Results for orders placed or performed during the hospital encounter of 07/06/16  Comprehensive Metabolic Panel  Result Value Ref Range  Sodium 145 135 - 145 mmol/L  Potassium 3.3 (L) 3.5 - 5.0 mmol/L  Chloride 104 98 - 107 mmol/L  CO2 32.0 22.0 - 32.0 mmol/L  BUN 12 7 - 21 mg/dL  Creatinine 0.90 0.60 - 1.00 mg/dL  BUN/Creatinine Ratio 13  GFR MDRD Non Af Amer >60 mL/min/1.72m2  GFR MDRD Af Amer >60 60 - 999 mL/min/1.48m2  Anion Gap 9 mmol/L  Glucose 95 74 - 106 mg/dL  Calcium 9.3 8.5 - 10.2 mg/dL   Albumin 4.5 3.4 - 5.0 g/dL  Total Protein 7.8 6.6 - 8.0 g/dL  Total Bilirubin 0.7 0.1 - 1.2 mg/dL  AST 30 14 - 38 U/L  ALT 39 15 - 48 U/L  Alkaline Phosphatase 104 38 - 126 U/L  Lipase Level  Result Value Ref Range  Lipase 90 44 - 232 U/L  Urinalysis  Result Value Ref Range  Color, UA Yellow  Clarity, UA Hazy  pH, UA 7.0 5.0 - 7.0  Leukocyte Esterase, UA Negative Negative  Nitrite, UA Negative Negative  Protein, UA Trace (A) Negative  Glucose, UA Negative Negative  Bilirubin, UA Small (A) Negative  RBC, UA 0 0 - 3 /HPF  WBC, UA 0 0 - 3 /HPF  Squam Epithel, UA 5 0 - 5 /HPF  Bacteria, UA Few (A) None Seen /HPF  Specific Gravity, UA 1.025 1.005 - 1.030  Ketones, UA Negative Negative  Urobilinogen, UA 1.0 mg/dL (A) 0.2 mg/dL  Blood, UA Negative Negative  Mucus, UA Many (A) None Seen /HPF  CBC w/ Differential  Result Value Ref Range  WBC 7.6 3.4 - 10.8 10*9/L  RBC 4.83 3.77 - 5.28 10*12/L  HGB 13.7 11.1 - 15.9 g/dL  HCT 41.5 34.0 - 46.6 %  MCV 85.9 79.0 - 97.0 fL  MCH 28.4 27.0 - 33.0 pg  MCHC 33.0 31.5 - 35.7 g/dL  RDW 13.9 12.3 - 15.4 %  MPV 10.1 9.0 - 12.0 fL  Platelet 207 155 - 379 10*9/L   CT scan: CONTRAST TECHNIQUE: Multidetector CT imaging of the abdomen and pelvis was performed using the standard protocol following bolus administration of intravenous contrast. CONTRAST: 100 cc of Omnipaque 300 IV with dilute Omnipaque 240 given orally COMPARISON: None. FINDINGS: LOWER CHEST: Lung bases are clear. Included heart size is normal. No pericardial effusion. HEPATOBILIARY: Liver is unremarkable. Cholecystectomy. PANCREAS: Mild pancreatic atrophy. SPLEEN: Normal. ADRENALS/URINARY TRACT: Kidneys are orthotopic, demonstrating symmetric enhancement. No nephrolithiasis, hydronephrosis or solid renal masses. The unopacified ureters are normal in course and caliber. Delayed imaging through the kidneys demonstrates symmetric prompt contrast excretion within the proximal urinary  collecting system. Urinary bladder is partially distended and unremarkable. Normal adrenal glands. STOMACH/BOWEL: The stomach, small and large bowel are normal in course and caliber without inflammatory changes. Abnormal inflamed and distended appearing retrocecal appendix measuring up to 9 mm in diameter. No perforation or abscess. There is adjacent mesenteric fatty inflammation. VASCULAR/LYMPHATIC: Aortoiliac vessels are normal in course and caliber. No lymphadenopathy by CT size criteria. REPRODUCTIVE: Hysterectomy and bilateral tubal ligations. OTHER: No intraperitoneal free fluid or free air. MUSCULOSKELETAL: Nonacute. Acute appendicitis without complication, retrocecal in position measuring up to 9 mm        Past Medical History:  Diagnosis Date  .  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 Followed by Dr. Wilfrid Lund    . Anxiety   . Arthritis   . Asthma   . Chronic headaches   . Colon polyps   . Depression   . Diverticulosis   . Fibromyalgia   . GERD (gastroesophageal reflux disease)   . Hyperlipemia   . Hypertension   . IBS (irritable bowel syndrome)   . Osteoporosis   . Sleep apnea    wears c-pap    Past Surgical History:  Procedure Laterality Date  . ABDOMINAL HYSTERECTOMY    . cervical neck fusion    . CHOLECYSTECTOMY  2003  . csc    . ELBOW SURGERY Right   . SHOULDER SURGERY Right   . SHOULDER SURGERY Left   . uterous procedure     "burned cells in uterous" per pt    Family History  Problem Relation Age of Onset  . Colon cancer Mother     45's  . Colon polyps Mother   . Diabetes Mother   . Heart disease Mother   . Pancreatic cancer Mother   . Irritable bowel syndrome Sister   . Renal Disease Sister   . Heart disease Father   . Kidney cancer Sister   . Breast cancer Cousin     1st cousin maternal  . Pancreatic cancer Cousin     1st cousin maternal  . Esophageal cancer Neg Hx   . Rectal cancer Neg Hx   . Stomach cancer Neg Hx     Social  History:  reports that she quit smoking about 26 years ago. Her smoking use included Cigarettes. She has a 60.00 pack-year smoking history. She has never used smokeless tobacco. She reports that she does not drink alcohol or use drugs. Tobacco:  60 pack year hx quit some years ago ETOH:  None DRUGS:  None Married lives with family On disability since 2003 Allergies:  Allergies  Allergen Reactions  . Macrobid [Nitrofurantoin] Anaphylaxis  . Paxil [Paroxetine Hcl] Nausea And Vomiting  . Avelox [Moxifloxacin Hcl In Nacl] Nausea Only  . Erythromycin Nausea Only  . Levaquin [Levofloxacin] Nausea Only  . Ultram [Tramadol] Hives    ER (extended release) only.  Can tolerate immediate release.  . Vicodin [Hydrocodone-Acetaminophen] Other (See Comments)    Increases anxiety    Prior to Admission medications   Medication Sig Start Date End Date Taking? Authorizing Provider  albuterol (PROVENTIL) (2.5 MG/3ML) 0.083% nebulizer solution Take 2.5 mg by nebulization every 6 (six) hours as needed for wheezing or shortness of breath.   Yes Historical Provider, MD  atorvastatin (LIPITOR) 20 MG tablet Take 20 mg by mouth daily.   Yes Historical Provider, MD  clonazePAM (KLONOPIN) 2 MG tablet Take 2 mg by mouth 2 (two) times daily.   Yes Historical Provider, MD  cyclobenzaprine (FLEXERIL) 10 MG tablet Take 1 tablet by mouth 2 (two) times daily. 07/06/16  Yes Historical Provider, MD  dexlansoprazole (DEXILANT) 60 MG capsule Take 60 mg by mouth 2 (two) times daily at 10 AM and 5 PM.   Yes Historical Provider, MD  dicyclomine (BENTYL) 20 MG tablet Take 20 mg by mouth every 6 (six) hours.   Yes Historical Provider, MD  levalbuterol [facility] HFA) 45 MCG/ACT inhaler Inhale 2 puffs into the lungs every 6 (six) hours as needed for wheezing.   Yes Historical Provider, MD  linaclotide (LINZESS) 290 MCG CAPS capsule Take 290 mcg by mouth daily before breakfast.   Yes  Historical Provider, MD  lisinopril  (PRINIVIL,ZESTRIL) 10 MG tablet Take 10 mg by mouth daily.   Yes Historical Provider, MD  montelukast (SINGULAIR) 10 MG tablet Take 10 mg by mouth at bedtime.   Yes Historical Provider, MD  morphine (MS CONTIN) 30 MG 12 hr tablet Take 30 mg by mouth every 12 (twelve) hours.   Yes Historical Provider, MD  Olopatadine HCl (PATADAY) 0.2 % SOLN Apply to eye.   Yes Historical Provider, MD  ondansetron (ZOFRAN) 8 MG tablet Take by mouth every 8 (eight) hours as needed for nausea or vomiting.   Yes Historical Provider, MD  polyethylene glycol (MIRALAX / GLYCOLAX) packet Take 17 g by mouth daily.   Yes Historical Provider, MD  QUEtiapine (SEROQUEL) 50 MG tablet Take 25 mg by mouth at bedtime.   Yes Historical Provider, MD  sucralfate (CARAFATE) 1 GM/10ML suspension Take 1 g by mouth 2 (two) times daily.   Yes Historical Provider, MD  topiramate (TOPAMAX) 50 MG tablet Take 50 mg by mouth daily.   Yes Historical Provider, MD  Vortioxetine HBr (BRINTELLIX) 10 MG TABS Take 1 tablet by mouth daily.    Yes Historical Provider, MD  desvenlafaxine (PRISTIQ) 50 MG 24 hr tablet Take 1 tablet (50 mg total) by mouth daily. Patient not taking: Reported on 05/19/2016 03/15/12   Patrick Jupiter, PA-C     No results found for this or any previous visit (from the past 48 hour(s)).  No results found.  Review of Systems  Constitutional: Positive for fever, malaise/fatigue and weight loss.  HENT: Negative.   Eyes: Negative.   Respiratory: Positive for shortness of breath (DOE) and wheezing (uses inhalers PRN).        Hx of sleep apnea, uses CPAP when not in pain  Cardiovascular: Positive for orthopnea, leg swelling and PND.  Gastrointestinal: Positive for abdominal pain (pain on right more than left, worst in the RLQ), blood in stool (Hx of opoid constipation with some bleeding from this), constipation (last BM 4 days ago), heartburn, nausea and vomiting (occasional). Negative for diarrhea.  Genitourinary: Positive for  dysuria (she has pain with needing to void, and after voiding), frequency and urgency.  Musculoskeletal: Positive for back pain, joint pain, myalgias and neck pain.       She has fibromyalgia, pain all over.  Worse with current pain, and walking.  Pain in back, neck, right leg and feet walking.  Skin: Negative.   Neurological: Positive for dizziness and headaches (she has migraines and take Topamax BID for this).  Endo/Heme/Allergies: Bruises/bleeds easily.  Psychiatric/Behavioral: Positive for depression. The patient is nervous/anxious and has insomnia.    Blood pressure 143/81, pulse (!) 59, temperature 97.7 F (36.5 C), temperature source Oral, resp. rate 18, height 5\' 2"  (1.575 m), weight 75.3 kg (166 lb), SpO2 98 %. Physical Exam  Assessment/Plan: Abdominal pain with appendicitis COPD/Asthma 60 year hx of tobacco use Sleep apnea with CPAP Chronic pain/Fibromyalgia - MS contin BID at home Chronic opoid induced constipation - Dr. Madelon Lips GI Migraines Hypertension Anxiety/depression - Psychiatry follows Multiple allergies Multiple Medications at home GERD Dyslipidemia On disability since 2003  Plan:  Admit and plan appendectomy later today.  Ask Medicine to see and help with medical issues.  Rechecking labs so we have a baseline here in the hospital.     Earnstine Regal 07/07/2016, 7:14 AM

## 2016-07-07 NOTE — ED Notes (Signed)
Bed: [id number] Expected date:  Expected time:  Means of arrival:  Comments: Tx from [place] appy-patient of Dr. Hassell Done

## 2016-07-07 NOTE — Transfer of Care (Signed)
Immediate Anesthesia Transfer of Care Note  Patient: Amber Harrell  Procedure(s) Performed: Procedure(s): APPENDECTOMY LAPAROSCOPIC (N/A)  Patient Location: PACU  Anesthesia Type:General  Level of Consciousness:  sedated, patient cooperative and responds to stimulation  Airway & Oxygen Therapy:Patient Spontanous Breathing and Patient connected to face mask oxgen  Post-op Assessment:  Report given to PACU RN and Post -op Vital signs reviewed and stable  Post vital signs:  Reviewed and stable  Last Vitals:  Vitals:   07/07/16 0232 07/07/16 0527  BP: 156/78 143/81  Pulse: 67 (!) 59  Resp: 16 18  Temp: 36.4 C [id number] C    Complications: No apparent anesthesia complications

## 2016-07-07 NOTE — Anesthesia Procedure Notes (Addendum)
Procedure Name: Intubation Date/Time: 07/07/2016 12:10 PM Performed by: Maxwell Caul Pre-anesthesia Checklist: Patient identified, Emergency Drugs available, Suction available and Patient being monitored Patient Re-evaluated:Patient Re-evaluated prior to inductionOxygen Delivery Method: Circle system utilized Preoxygenation: Pre-oxygenation with 100% oxygen Intubation Type: IV induction, Rapid sequence and Cricoid Pressure applied Laryngoscope Size: Mac and 4 Grade View: Grade I Tube type: Oral Tube size: 7.5 mm Number of attempts: 1 Airway Equipment and Method: Stylet Placement Confirmation: ETT inserted through vocal cords under direct vision,  positive ETCO2 and breath sounds checked- equal and bilateral Secured at: 21 cm Tube secured with: Tape Dental Injury: Teeth and Oropharynx as per pre-operative assessment  Comments: Extra pillow placed under patients neck for comfort per her request. Neck maintained in neutral position during intubation.

## 2016-07-08 ENCOUNTER — Encounter (HOSPITAL_COMMUNITY): Payer: Self-pay | Admitting: Surgery

## 2016-07-08 DIAGNOSIS — I10 Essential (primary) hypertension: Secondary | ICD-10-CM | POA: Diagnosis present

## 2016-07-08 DIAGNOSIS — Z8371 Family history of colonic polyps: Secondary | ICD-10-CM | POA: Diagnosis not present

## 2016-07-08 DIAGNOSIS — K3580 Unspecified acute appendicitis: Secondary | ICD-10-CM | POA: Diagnosis not present

## 2016-07-08 DIAGNOSIS — Z833 Family history of diabetes mellitus: Secondary | ICD-10-CM | POA: Diagnosis not present

## 2016-07-08 DIAGNOSIS — Z800 Family history of malignant neoplasm of digestive organs: Secondary | ICD-10-CM | POA: Diagnosis not present

## 2016-07-08 DIAGNOSIS — R1031 Right lower quadrant pain: Secondary | ICD-10-CM | POA: Diagnosis present

## 2016-07-08 DIAGNOSIS — Z8051 Family history of malignant neoplasm of kidney: Secondary | ICD-10-CM | POA: Diagnosis not present

## 2016-07-08 DIAGNOSIS — M797 Fibromyalgia: Secondary | ICD-10-CM

## 2016-07-08 DIAGNOSIS — K353 Acute appendicitis with localized peritonitis: Secondary | ICD-10-CM | POA: Diagnosis present

## 2016-07-08 DIAGNOSIS — Z90710 Acquired absence of both cervix and uterus: Secondary | ICD-10-CM | POA: Diagnosis not present

## 2016-07-08 DIAGNOSIS — G4733 Obstructive sleep apnea (adult) (pediatric): Secondary | ICD-10-CM | POA: Diagnosis not present

## 2016-07-08 DIAGNOSIS — G894 Chronic pain syndrome: Secondary | ICD-10-CM | POA: Diagnosis present

## 2016-07-08 DIAGNOSIS — Z981 Arthrodesis status: Secondary | ICD-10-CM | POA: Diagnosis not present

## 2016-07-08 DIAGNOSIS — Z825 Family history of asthma and other chronic lower respiratory diseases: Secondary | ICD-10-CM | POA: Diagnosis not present

## 2016-07-08 DIAGNOSIS — E785 Hyperlipidemia, unspecified: Secondary | ICD-10-CM | POA: Diagnosis present

## 2016-07-08 DIAGNOSIS — I959 Hypotension, unspecified: Secondary | ICD-10-CM

## 2016-07-08 DIAGNOSIS — G43909 Migraine, unspecified, not intractable, without status migrainosus: Secondary | ICD-10-CM

## 2016-07-08 DIAGNOSIS — Z841 Family history of disorders of kidney and ureter: Secondary | ICD-10-CM | POA: Diagnosis not present

## 2016-07-08 DIAGNOSIS — Z87442 Personal history of urinary calculi: Secondary | ICD-10-CM | POA: Diagnosis not present

## 2016-07-08 DIAGNOSIS — K219 Gastro-esophageal reflux disease without esophagitis: Secondary | ICD-10-CM | POA: Diagnosis present

## 2016-07-08 DIAGNOSIS — Z86010 Personal history of colonic polyps: Secondary | ICD-10-CM | POA: Diagnosis not present

## 2016-07-08 DIAGNOSIS — Z8249 Family history of ischemic heart disease and other diseases of the circulatory system: Secondary | ICD-10-CM | POA: Diagnosis not present

## 2016-07-08 DIAGNOSIS — F411 Generalized anxiety disorder: Secondary | ICD-10-CM

## 2016-07-08 DIAGNOSIS — Z803 Family history of malignant neoplasm of breast: Secondary | ICD-10-CM | POA: Diagnosis not present

## 2016-07-08 HISTORY — DX: Fibromyalgia: M79.7

## 2016-07-08 HISTORY — DX: Essential (primary) hypertension: I10

## 2016-07-08 HISTORY — DX: Migraine, unspecified, not intractable, without status migrainosus: G43.909

## 2016-07-08 HISTORY — DX: Generalized anxiety disorder: F41.1

## 2016-07-08 LAB — URINE CULTURE: CULTURE: NO GROWTH

## 2016-07-08 MED ORDER — SUCRALFATE 1 GM/10ML PO SUSP
1.0000 g | Freq: Two times a day (BID) | ORAL | Status: DC
  Administered 2016-07-08: 1 g via ORAL
  Filled 2016-07-08: qty 10

## 2016-07-08 MED ORDER — OXYCODONE HCL 5 MG PO TABS: 5.0000 mg | ORAL_TABLET | ORAL | 0 refills | Status: DC | PRN

## 2016-07-08 MED ORDER — LINACLOTIDE 290 MCG PO CAPS
290.0000 ug | ORAL_CAPSULE | Freq: Every day | ORAL | Status: DC
  Administered 2016-07-08: 290 ug via ORAL
  Filled 2016-07-08: qty 1

## 2016-07-08 MED ORDER — SODIUM CHLORIDE 0.9 % IV BOLUS (SEPSIS): 500.0000 mL | Freq: Once | INTRAVENOUS | Status: DC

## 2016-07-08 MED ORDER — LISINOPRIL 10 MG PO TABS: ORAL_TABLET | ORAL | Status: AC

## 2016-07-08 MED ORDER — TOPIRAMATE 25 MG PO TABS: 50.0000 mg | ORAL_TABLET | Freq: Every day | ORAL | Status: DC

## 2016-07-08 MED ORDER — ACETAMINOPHEN 325 MG PO TABS: 650.0000 mg | ORAL_TABLET | Freq: Four times a day (QID) | ORAL | Status: AC | PRN

## 2016-07-08 NOTE — Progress Notes (Signed)
1 Day Post-Op  Subjective: Sites look fine, she ate fairly well according to her daughter yesterday PM.  On O2 and I have taken it off.  Will advance diet, get her walking and aim for D/C home later today if she can go.  Both husband and daughter sleeping in the room with her.    Objective: Vital signs in last 24 hours: Temp:  [95.7 F (35.4 C)-98.3 F (36.8 C)] 97.7 F (36.5 C) (01/18 0600) Pulse Rate:  [57-90] 81 (01/18 0600) Resp:  [13-20] 18 (01/18 0600) BP: (95-166)/(49-93) 95/49 (01/18 0600) SpO2:  [95 %-100 %] 98 % (01/18 0600) Last BM Date: 07/06/16 960 PO 1900 IV 2640 urine Afebrile, VSS No labs Intake/Output from previous day: 01/17 0701 - 01/18 0700 In: 2862.5 [P.O.:960; I.V.:1802.5; IV Piggyback:100] Out: 2660 [Urine:2640; Blood:20] Intake/Output this shift: No intake/output data recorded.  General appearance: alert, cooperative and no distress Resp: clear to auscultation bilaterally GI: soft, sore, sites all look good  Lab Results:   Recent Labs  07/07/16 0906  WBC 6.3  HGB 12.2  HCT 35.5*  PLT 168    BMET  Recent Labs  07/07/16 0906  NA 139  K 3.7  CL 106  CO2 28  GLUCOSE 99  BUN 12  CREATININE 1.01*  CALCIUM 8.8*   PT/INR  Recent Labs  07/07/16 0906  LABPROT 13.6  INR 1.04     Recent Labs Lab 07/07/16 0906  AST 20  ALT 21  ALKPHOS 83  BILITOT 1.0  PROT 6.5  ALBUMIN 3.7     Lipase  No results found for: LIPASE   Studies/Results: No results found.  Medications: . atorvastatin  20 mg Oral q1800  . clonazePAM  2 mg Oral BID  . lisinopril  10 mg Oral Daily  . montelukast  10 mg Oral QHS  . morphine  30 mg Oral Q12H  . pantoprazole (PROTONIX) IV  40 mg Intravenous QHS  . piperacillin-tazobactam (ZOSYN)  IV  3.375 g Intravenous Q8H  . QUEtiapine  25 mg Oral QHS  . topiramate  50 mg Oral Daily  . venlafaxine XR  75 mg Oral Q breakfast  . vortioxetine HBr  10 mg Oral Daily   . dextrose 5 % and 0.45 % NaCl with KCl  20 mEq/L 75 mL/hr (07/07/16 1838)   Assessment/Plan Acute retrocecal appendicitis S/p laparoscopic appendectomy, 07/07/16, Dr. Harlow Asa COPD/Asthma 60 year hx of tobacco use Sleep apnea with CPAP Chronic pain/Fibromyalgia - MS contin BID at home Chronic opoid induced constipation - Dr. Madelon Lips GI Migraines Hypertension Anxiety/depression - Psychiatry follows Multiple allergies Multiple Medications at home GERD Dyslipidemia On disability since 2003 FEN:  IV fluids/Regular diet ID:  Zosyn day 2 - will stop Zosyn now DVT:  Lovenox if she stays/SCD  Plan:  Mobilize, restart home Meds, and home later today if possible       LOS: 0 days    Amber Harrell 07/08/2016 [phone]

## 2016-07-08 NOTE — Progress Notes (Signed)
Repeat BP down to 81/51 this AM, will hold Lisinopril and check BP more closely. She is getting fluid bolus now.

## 2016-07-08 NOTE — Progress Notes (Signed)
PROGRESS NOTE    Amber Harrell  [id number]  DOB: [birth year]  DOA: 07/07/2016 PCP: Bonnita Nasuti, MD  Hospital course: Amber Harrell is an 56 y.o. female who was admitted by the surgical service for acute appendicitis. Currently patient is back from surgery, she complains of abdominal pain, at the surgical site, the pain is 10 out of 10 in intensity, is worse with movement, there is no improving factors, there is no associated symptoms, denies any nausea or vomiting. Surgical team requested a consult to follow-up on medical conditions including hypertension, asthma, fibromyalgia during her hospital stay.  Assessment & Plan:   1. Hypertension. - Holding BP meds d/t hypotension, IVF bolus in progress and recheck BP improved BP 100/72  2. Asthma. Lungs are clear to auscultation, continue albuterol as needed, oximetry monitoring, encourage use of incentive spirometer.  3. Fibromyalgia. Continue clonazepam, Seroquel, topiramate, desvenlafaxine, vortioxetine. Will continue MS Contin 30 g every 12 hours to prevent withdrawal  4. Dyslipidemia. Continue atorvastatin.  5. GERD. Continue sucralfate and antiacid therapy/PPI.  Subjective: Pt has been hypotensive.  Receiving a fluid bolus.   Objective: Vitals:   07/08/16 0600 07/08/16 1034 07/08/16 1048 07/08/16 1243  BP: (!) 95/49 (!) 81/51 (!) 82/50 100/72  Pulse: 81 72  81  Resp: 18 18  16   Temp: 97.7 F (36.5 C) 97.5 F (36.4 C)  98.4 F (36.9 C)  TempSrc: Oral Oral  Oral  SpO2: 98% 97% 98% 99%  Weight:      Height:        Intake/Output Summary (Last 24 hours) at 07/08/16 1318 Last data filed at 07/08/16 1244  Gross per 24 hour  Intake           1862.5 ml  Output             2890 ml  Net          -1027.5 ml   Filed Weights   07/07/16 0234  Weight: 75.3 kg (166 lb)    Exam:  General exam: awake, alert, NAD, cooperative.  Respiratory system:  No increased work of breathing. Cardiovascular system: S1 & S2 heard.  No JVD, murmurs, gallops, clicks or pedal edema. Gastrointestinal system: Abdomen is soft and tender Central nervous system: Alert and oriented. No focal neurological deficits. Extremities: no Cyanosis.   Data Reviewed: Basic Metabolic Panel:  Recent Labs Lab 07/07/16 0906  NA 139  K 3.7  CL 106  CO2 28  GLUCOSE 99  BUN 12  CREATININE 1.01*  CALCIUM 8.8*   Liver Function Tests:  Recent Labs Lab 07/07/16 0906  AST 20  ALT 21  ALKPHOS 83  BILITOT 1.0  PROT 6.5  ALBUMIN 3.7   No results for input(s): LIPASE, AMYLASE in the last 168 hours. No results for input(s): AMMONIA in the last 168 hours. CBC:  Recent Labs Lab 07/07/16 0906  WBC 6.3  HGB 12.2  HCT 35.5*  MCV 84.3  PLT 168   Cardiac Enzymes: No results for input(s): CKTOTAL, CKMB, CKMBINDEX, TROPONINI in the last 168 hours. CBG (last 3)  No results for input(s): GLUCAP in the last 72 hours. Recent Results (from the past 240 hour(s))  Urine culture     Status: None   Collection Time: 07/07/16 10:30 AM  Result Value Ref Range Status   Specimen Description URINE, RANDOM  Final   Special Requests NONE  Final   Culture   Final    NO GROWTH Performed at [facility], [place] [address], [place], [place] [address]    Report Status 07/08/2016 FINAL  Final    Studies: No results found.  Scheduled Meds: . atorvastatin  20 mg Oral q1800  . clonazePAM  2 mg Oral BID  . linaclotide  290 mcg Oral QAC breakfast  . montelukast  10 mg Oral QHS  . morphine  30 mg Oral Q12H  . QUEtiapine  25 mg Oral QHS  . sodium chloride  500 mL Intravenous Once  . sucralfate  1 g Oral BID  . topiramate  50 mg Oral Daily  . venlafaxine XR  75 mg Oral Q breakfast  . vortioxetine HBr  10 mg Oral Daily   Continuous Infusions:  Principal Problem:   Acute appendicitis Active Problems:   Agoraphobia with panic disorder   Depression   Chronic pain syndrome   Obstructive sleep apnea   Essential hypertension    Migraines   Generalized anxiety disorder  Time spent:   Irwin Brakeman, MD, FAAFP [facility] Pager [phone]  If 7PM-7AM, please contact night-coverage [email or web address] Password [email or web address] 07/08/2016, 1:18 PM    LOS: 0 days

## 2016-07-08 NOTE — Discharge Summary (Signed)
Physician Discharge Summary  Patient ID: Amber Harrell MRN: [id number] DOB/AGE: 12/27/1960 56 y.o.  Admit date: 07/07/2016 Discharge date: 07/08/2016 PCP:  Bonnita Nasuti, MD  Dr. Milly Jakob, also listed Psychiatry: Dr. Chaya Jan - [place]    Admission Diagnoses:  Abdominal pain with appendicitis COPD/Asthma 56 year hx of tobacco use Sleep apnea with CPAP Chronic pain/Fibromyalgia - MS contin BID at home Chronic opoid induced constipation - Dr. Madelon Lips GI Migraines Hypertension Anxiety/depression - Psychiatry follows Multiple allergies Multiple Medications at home GERD Dyslipidemia On disability since 2003  Discharge Diagnoses:   Acute retrocecal appendicitis COPD/Asthma 56 year hx of tobacco use Sleep apnea with CPAP Chronic pain/Fibromyalgia - MS contin BID at home Chronic opoid induced constipation - Dr. Madelon Lips GI Migraines Hypertension Anxiety/depression - Psychiatry follows Multiple allergies Multiple Medications at home GERD Dyslipidemia On disability since 2003     Principal Problem:   Acute appendicitis Active Problems:   Chronic pain syndrome   Depression   Obstructive sleep apnea   Essential hypertension   Migraines   Generalized anxiety disorder   Agoraphobia with panic disorder   PROCEDURES: Laparoscopic appendectomy 07/07/16, Dr. Hyman Bible Course:  Abdominal Pain - she reports pain since 06/28/16 when she saw Dr. Posey Rea. She see's him monthly for chronic pain. She reports nausea with eating, but not weight loss. Pain also with voiding, and after voiding. Pain has become worse and she presented to the ED at [facility]. Work up from [place] is below. She was eventually transferred to [facility] for evaluation and treatment.  CT scan found: Abnormal inflamed and distended appearing retrocecal appendix measuring up to 9 mm in diameter. No perforation or abscess. There is adjacent mesenteric fatty inflammation.  She was eventually accepted  at [facility] and we saw her in the AM 07/07/16.  She was taken to the OR later that AM and underwent appendectomy as noted above.   Post op she was mobilized, diet advanced and we placed her back on all her preadmission medications, including her MS contin.  We told her we would not evaluate her chronic issues and defer to her primary care.  Additional pain medications need to be obtained from her pain management providers.  I told patient , her daughter and husband we would not renew pain medicines.  She know she is going to be sore for a couple weeks.  Her BP is down some and improved with fluid.  We are holding her lisinopril till her BP is greater than 130.  She complains of burning stomach when she eats.  She is on PPI and Carafate.  She had EGD and colonoscopy 10/2015 by Dr. Loletha Carrow.  I have ask her to follow up with them.  I have also ask her to increase her PO intake to 2 liters per day.  She is to record her BP's at home and take to primary care doctors and discuss care at a Pain clinic.  She reports being sleepy and lethargic at home.     CBC Latest Ref Rng & Units 07/07/2016 05/09/2010 05/09/2010  WBC 4.0 - 10.5 K/uL 6.3 - 4.8  Hemoglobin 12.0 - 15.0 g/dL 12.2 12.6 11.6(L)  Hematocrit 36.0 - 46.0 % 35.5(L) 37.0 35.7(L)  Platelets 150 - 400 K/uL 168 - 177   CMP Latest Ref Rng & Units 07/07/2016 05/19/2016 05/09/2010  Glucose 65 - 99 mg/dL 99 - 96  BUN 6 - 20 mg/dL 12 - 12  Creatinine 0.44 - 1.00  mg/dL 1.01(H) 0.92 0.9  Sodium 135 - 145 mmol/L 139 - 143  Potassium 3.5 - 5.1 mmol/L 3.7 - 3.5  Chloride 101 - 111 mmol/L 106 - 109  CO2 22 - 32 mmol/L 28 - -  Calcium 8.9 - 10.3 mg/dL 8.8(L) - -  Total Protein 6.5 - 8.1 g/dL 6.5 - -  Total Bilirubin 0.3 - 1.2 mg/dL 1.0 - -  Alkaline Phos 38 - 126 U/L 83 - -  AST 15 - 41 U/L 20 - -  ALT 14 - 54 U/L 21 - -           Disposition: 01-Home or Self Care   Allergies as of 07/08/2016      Reactions   Macrobid [nitrofurantoin] Anaphylaxis    Paxil [paroxetine Hcl] Nausea And Vomiting   Avelox [moxifloxacin Hcl In Nacl] Nausea Only   Erythromycin Nausea Only   Levaquin [levofloxacin] Nausea Only   Ultram [tramadol] Hives   ER (extended release) only.  Can tolerate immediate release.   Vicodin [hydrocodone-acetaminophen] Other (See Comments)   Increases anxiety      Medication List    STOP taking these medications   desvenlafaxine 50 MG 24 hr tablet Commonly known as:  PRISTIQ     TAKE these medications   acetaminophen 325 MG tablet Commonly known as:  TYLENOL Take 2 tablets (650 mg total) by mouth every 6 (six) hours as needed for mild pain (or temp > 100).   albuterol (2.5 MG/3ML) 0.083% nebulizer solution Commonly known as:  PROVENTIL Take 2.5 mg by nebulization every 6 (six) hours as needed for wheezing or shortness of breath.   atorvastatin 20 MG tablet Commonly known as:  LIPITOR Take 20 mg by mouth daily.   BRINTELLIX 10 MG Tabs Generic drug:  vortioxetine HBr Take 1 tablet by mouth daily.   clonazePAM 2 MG tablet Commonly known as:  KLONOPIN Take 2 mg by mouth 2 (two) times daily.   cyclobenzaprine 10 MG tablet Commonly known as:  FLEXERIL Take 1 tablet by mouth 2 (two) times daily.   DEXILANT 60 MG capsule Generic drug:  dexlansoprazole Take 60 mg by mouth 2 (two) times daily at 10 AM and 5 PM.   dicyclomine 20 MG tablet Commonly known as:  BENTYL Take 20 mg by mouth every 6 (six) hours.   levalbuterol 45 MCG/ACT inhaler Commonly known as:  XOPENEX HFA Inhale 2 puffs into the lungs every 6 (six) hours as needed for wheezing.   LINZESS 290 MCG Caps capsule Generic drug:  linaclotide Take 290 mcg by mouth daily before breakfast.   lisinopril 10 MG tablet Commonly known as:  PRINIVIL,ZESTRIL Hold on this medicine till her BP is more than 130 top number and over 70 bottom number.  130/70.   If the top number is less than 95, call your Primary care doctor and see him. What changed:  how  much to take  how to take this  when to take this  additional instructions   montelukast 10 MG tablet Commonly known as:  SINGULAIR Take 10 mg by mouth at bedtime.   MS CONTIN 30 MG 12 hr tablet Generic drug:  morphine Take 30 mg by mouth every 12 (twelve) hours.   ondansetron 8 MG tablet Commonly known as:  ZOFRAN Take by mouth every 8 (eight) hours as needed for nausea or vomiting.   oxyCODONE 5 MG immediate release tablet Commonly known as:  Oxy IR/ROXICODONE Take 1-2 tablets (5-10 mg total) by  mouth every 4 (four) hours as needed for moderate pain.   PATADAY 0.2 % Soln Generic drug:  Olopatadine HCl Apply to eye.   polyethylene glycol packet Commonly known as:  MIRALAX / GLYCOLAX Take 17 g by mouth daily.   QUEtiapine 50 MG tablet Commonly known as:  SEROQUEL Take 25 mg by mouth at bedtime.   sucralfate 1 GM/10ML suspension Commonly known as:  CARAFATE Take 1 g by mouth 2 (two) times daily.   topiramate 50 MG tablet Commonly known as:  TOPAMAX Take 50 mg by mouth daily.      Follow-up Information    [facility] Follow up on 07/27/2016.   Specialty:  General Surgery Why:   Your appointment is at 10:30 AM,.  Be at the office 30 minutes early for check in.  Our office will not refill pain medication request. You need to see your pain medicine doctor to recieve any additional medications. Contact information: [address] [place] [address] [phone]        Bonnita Nasuti, MD Follow up.   Specialty:  Internal Medicine Why:  Follow up for medical issues with your primary care, Dr. Posey Rea, or this person listed.  Restart your lisiniopril when blood pressure is top number is 130 or higher.   Call if that  number is less than 95.  Contact information: [address] [place] [address] [phone]        Henry L Danis III, MD Follow up.   Specialty:  Gastroenterology Why:  follow up with them for your constipation  and other abdominal issues. Contact information: [address] [place] [address] [phone]           Signed: Earnstine Regal 07/08/2016, 4:34 PM

## 2016-07-08 NOTE — Discharge Instructions (Signed)
[facility] ______[facility] LAPAROSCOPIC SURGERY: POST OP INSTRUCTIONS  DRINK at least 2 liter of liquid per day.  Record blood pressure and heart rate at home 2-3 times per day and take with you to all medical and psychiatric follow up appointments  Hold Blood pressure medicine Lisinopril till your systolic blood pressure is up to 130 - top number   Always review your discharge instruction sheet given to you by the facility where your surgery was performed. IF YOU HAVE DISABILITY OR FAMILY LEAVE FORMS, YOU MUST BRING THEM TO THE OFFICE FOR PROCESSING.   DO NOT GIVE THEM TO YOUR DOCTOR.  1. A prescription for pain medication may be given to you upon discharge.  Take your pain medication as prescribed, if needed.  If narcotic pain medicine is not needed, then you may take acetaminophen (Tylenol) or ibuprofen (Advil) as needed. 2. Take your usually prescribed medications unless otherwise directed. 3. If you need a refill on your pain medication, please contact your pharmacy.  They will contact our office to request authorization. Prescriptions will not be filled after 5pm or on week-ends. 4. You should follow a light diet the first few days after arrival home, such as soup and crackers, etc.  Be sure to include lots of fluids daily. 5. Most patients will experience some swelling and bruising in the area of the incisions.  Ice packs will help.  Swelling and bruising can take several days to resolve.  6. It is common to experience some constipation if taking pain medication after surgery.  Increasing fluid intake and taking a stool softener (such as Colace) will usually help or prevent this problem from occurring.  A mild laxative (Milk of Magnesia or Miralax) should be taken according to package instructions if there are no bowel movements after 48 hours. 7. Unless discharge instructions indicate otherwise, you may remove your bandages 24-48 hours after surgery, and you may shower at  that time.  You may have steri-strips (small skin tapes) in place directly over the incision.  These strips should be left on the skin for 7-10 days.  If your surgeon used skin glue on the incision, you may shower in 24 hours.  The glue will flake off over the next 2-3 weeks.  Any sutures or staples will be removed at the office during your follow-up visit. 8. ACTIVITIES:  You may resume regular (light) daily activities beginning the next day--such as daily self-care, walking, climbing stairs--gradually increasing activities as tolerated.  You may have sexual intercourse when it is comfortable.  Refrain from any heavy lifting or straining until approved by your doctor. a. You may drive when you are no longer taking prescription pain medication, you can comfortably wear a seatbelt, and you can safely maneuver your car and apply brakes. b. RETURN TO WORK:  __________________________________________________________ 9. You should see your doctor in the office for a follow-up appointment approximately 2-3 weeks after your surgery.  Make sure that you call for this appointment within a day or two after you arrive home to insure a convenient appointment time. 10. OTHER INSTRUCTIONS: __________________________________________________________________________________________________________________________ __________________________________________________________________________________________________________________________ WHEN TO CALL YOUR DOCTOR: 1. Fever over 101.0 2. Inability to urinate 3. Continued bleeding from incision. 4. Increased pain, redness, or drainage from the incision. 5. Increasing abdominal pain  The clinic staff is available to answer your questions during regular business hours.  Please dont hesitate to call and ask to speak to one of the nurses for clinical concerns.  If you have  a medical emergency, go to the nearest emergency room or call 911.  A surgeon from [facility]  is always on call at the hospital. [address] [place], [place], [place]  [address] ? [address] [place], [place],    [address] [phone] ? [phone] ? FAX [phone] Web site: [email or web address]   Laparoscopic Appendectomy, Adult, Care After Introduction These instructions give you information about caring for yourself after your procedure. Your doctor may also give you more specific instructions. Call your doctor if you have any problems or questions after your procedure. Follow these instructions at home: Medicines  Take over-the-counter and prescription medicines only as told by your doctor.  Do not drive for 24 hours if you received a sedative.  Do not drive or use heavy machinery while taking prescription pain medicine.  If you were prescribed an antibiotic medicine, take it as told by your doctor. Do not stop taking it even if you start to feel better. Activity  Do not lift anything that is heavier than 10 pounds (4.5 kg) for 3 weeks or as told by your doctor.  Do not play contact sports for 3 weeks or as told by your doctor.  Slowly return to your normal activities. Bathing  Keep your cuts from surgery (incisions) clean and dry.  Gently wash the cuts with soap and water.  Rinse the cuts with water until the soap is gone.  Pat the cuts dry with a clean towel. Do not rub the cuts.  You may take showers after 48 hours.  Do not take baths, swim, or use a hot tub for 2 weeks or as told by your doctor. Cut Care  Follow instructions from your doctor about how to take care of your cuts. Make sure you:  Wash your hands with soap and water before you change your bandage (dressing). If you do not have soap and water, use hand sanitizer.  Change your bandage as told by your doctor.  Leave stitches (sutures), skin glue, or skin tape (adhesive) strips in place. They may need to stay in place for 2 weeks or longer. If tape strips get loose and  curl up, you may trim the loose edges. Do not remove tape strips completely unless your doctor says it is okay.  Check your cuts every day for signs of infection. Check for:  More redness, swelling, or pain.  More fluid or blood.  Warmth.  Pus or a bad smell. Other Instructions  If you were sent home with a drain, follow instructions from your doctor about how to use it and care for it.  Take deep breaths. This helps to keep your lungs from getting swollen (inflamed).  To help with constipation:  Drink plenty of fluids.  Eat plenty of fruits and vegetables.  Keep all follow-up visits as told by your doctor. This is important. Contact a doctor if:  You have more redness, swelling, or pain around a cut from surgery.  You have more fluid or blood coming from a cut.  Your cut feels warm to the touch.  You have pus or a bad smell coming from a cut or a bandage.  The edges of a cut break open after the stitches have been taken out.  You have pain in your shoulders that gets worse.  You feel dizzy or you pass out (faint).  You have shortness of breath.  You keep feeling sick to your stomach (nauseous).  You keep throwing up (vomiting).  You get diarrhea or you cannot control your poop.  You lose your appetite.  You have swelling or pain in your legs. Get help right away if:  You have a fever.  You get a rash.  You have trouble breathing.  You have sharp pains in your chest. This information is not intended to replace advice given to you by your health care provider. Make sure you discuss any questions you have with your health care provider. Document Released: 04/03/2009 Document Revised: 11/13/2015 Document Reviewed: 11/25/2014  2017 Elsevier

## 2016-07-08 NOTE — Care Management Obs Status (Signed)
[facility] NOTIFICATION   Patient Details  Name: LASHONYA SHAFRAN MRN: [id number] Date of Birth: Jun 29, 1960   Medicare Observation Status Notification Given:  Yes    CrutchfieldAntony Haste, RN 07/08/2016, 2:56 PM

## 2016-07-21 ENCOUNTER — Encounter (HOSPITAL_COMMUNITY): Payer: Self-pay | Admitting: Emergency Medicine

## 2016-07-21 ENCOUNTER — Emergency Department (HOSPITAL_COMMUNITY): Payer: Medicare Other

## 2016-07-21 ENCOUNTER — Emergency Department (HOSPITAL_COMMUNITY)
Admission: EM | Admit: 2016-07-21 | Discharge: 2016-07-21 | Disposition: A | Discharge status: Home / Self Care | Payer: Medicare Other | Attending: Emergency Medicine | Admitting: Emergency Medicine

## 2016-07-21 DIAGNOSIS — Z79899 Other long term (current) drug therapy: Secondary | ICD-10-CM | POA: Diagnosis not present

## 2016-07-21 DIAGNOSIS — K567 Ileus, unspecified: Secondary | ICD-10-CM | POA: Insufficient documentation

## 2016-07-21 DIAGNOSIS — R0781 Pleurodynia: Secondary | ICD-10-CM | POA: Insufficient documentation

## 2016-07-21 DIAGNOSIS — R1031 Right lower quadrant pain: Secondary | ICD-10-CM | POA: Diagnosis present

## 2016-07-21 DIAGNOSIS — R109 Unspecified abdominal pain: Secondary | ICD-10-CM

## 2016-07-21 DIAGNOSIS — I10 Essential (primary) hypertension: Secondary | ICD-10-CM | POA: Diagnosis not present

## 2016-07-21 DIAGNOSIS — Z87891 Personal history of nicotine dependence: Secondary | ICD-10-CM | POA: Insufficient documentation

## 2016-07-21 LAB — URINALYSIS, ROUTINE W REFLEX MICROSCOPIC
Bilirubin Urine: NEGATIVE
Glucose, UA: NEGATIVE mg/dL
HGB URINE DIPSTICK: NEGATIVE
KETONES UR: NEGATIVE mg/dL
LEUKOCYTES UA: NEGATIVE
Nitrite: NEGATIVE
PROTEIN: NEGATIVE mg/dL
Specific Gravity, Urine: 1.019 (ref 1.005–1.030)
pH: 7 (ref 5.0–8.0)

## 2016-07-21 LAB — I-STAT CHEM 8, ED
BUN: 9 mg/dL (ref 6–20)
Calcium, Ion: 1.14 mmol/L — ABNORMAL LOW (ref 1.15–1.40)
Chloride: 106 mmol/L (ref 101–111)
Creatinine, Ser: 1 mg/dL (ref 0.44–1.00)
GLUCOSE: 84 mg/dL (ref 65–99)
HEMATOCRIT: 35 % — AB (ref 36.0–46.0)
HEMOGLOBIN: 11.9 g/dL — AB (ref 12.0–15.0)
POTASSIUM: 3.7 mmol/L (ref 3.5–5.1)
Sodium: 142 mmol/L (ref 135–145)
TCO2: 25 mmol/L (ref 0–100)

## 2016-07-21 MED ORDER — HYDROMORPHONE HCL 2 MG/ML IJ SOLN
1.0000 mg | Freq: Once | INTRAMUSCULAR | Status: AC
  Administered 2016-07-21: 1 mg via INTRAVENOUS
  Filled 2016-07-21: qty 1

## 2016-07-21 MED ORDER — DOCUSATE SODIUM 100 MG PO CAPS: 100.0000 mg | ORAL_CAPSULE | Freq: Two times a day (BID) | ORAL | 0 refills | Status: AC

## 2016-07-21 MED ORDER — IOPAMIDOL (ISOVUE-370) INJECTION 76%
INTRAVENOUS | Status: AC
  Filled 2016-07-21: qty 100

## 2016-07-21 MED ORDER — IOPAMIDOL (ISOVUE-300) INJECTION 61%
INTRAVENOUS | Status: AC
  Administered 2016-07-21: 100 mL
  Filled 2016-07-21: qty 100

## 2016-07-21 MED ORDER — ONDANSETRON HCL 4 MG/2ML IJ SOLN
4.0000 mg | Freq: Once | INTRAMUSCULAR | Status: AC
  Administered 2016-07-21: 4 mg via INTRAVENOUS
  Filled 2016-07-21: qty 2

## 2016-07-21 MED ORDER — MORPHINE SULFATE (PF) 4 MG/ML IV SOLN
6.0000 mg | Freq: Once | INTRAVENOUS | Status: AC
  Administered 2016-07-21: 6 mg via INTRAVENOUS
  Filled 2016-07-21: qty 2

## 2016-07-21 MED ORDER — SODIUM CHLORIDE 0.9 % IV BOLUS (SEPSIS)
1000.0000 mL | Freq: Once | INTRAVENOUS | Status: AC
  Administered 2016-07-21: 1000 mL via INTRAVENOUS

## 2016-07-21 MED ORDER — POLYETHYLENE GLYCOL 3350 17 GM/SCOOP PO POWD: 17.0000 g | Freq: Every day | ORAL | 0 refills | Status: AC

## 2016-07-21 MED ORDER — IOPAMIDOL (ISOVUE-370) INJECTION 76%
INTRAVENOUS | Status: AC
  Administered 2016-07-21: 100 mL
  Filled 2016-07-21: qty 100

## 2016-07-21 NOTE — ED Notes (Signed)
Patient able to ambulate independently  

## 2016-07-21 NOTE — ED Triage Notes (Signed)
Pt in from [facility] with c/o sob and elevated D-dimer, c/o RUQ pain. Recently had appendectomy on 17th at [place], [place] unable to do CT scan. Lap sites x 3 to abd. A&ox4

## 2016-07-21 NOTE — ED Provider Notes (Addendum)
[facility] DEPT Provider Note   CSN: [id number] Arrival date & time: 07/21/16  0707     History   Chief Complaint Chief Complaint  Patient presents with  . Shortness of Breath  . Abnormal Lab    HPI Amber Harrell is a 56 y.o. female.  HPI Patient underwent laparoscopic appendectomy on January 13 by Dr. Harlow Asa.  She presented to an outside hospital with at that time 36 hours of increasing abdominal pain and reported nausea vomiting as well as pain up under her right breast.  She was evaluated the outside hospital and thought to have a possible pulmonary embolus given her elevated d-dimer and the pain up under her right breast.  Patient reports no significant shortness of breath and really describes more right lower quadrant abdominal pain worsening over the past 48 hours.  No documented fever.  She reports nonbloody and nonbilious vomit at home.  Reports bowel movement 48 hours ago.  No history DVT.  No leg swelling.  No shortness of breath at this time.  O2 sats 100% on room air and pulse is 70.  Outside labs report normal LFTs, normal lipase, white blood cell count 6 and a creatinine of 0.9.   Past Medical History:  Diagnosis Date  . Allergy   . Anxiety   . Arthritis   . Asthma   . Chronic headaches   . Colon polyps   . Depression   . Diverticulosis   . Essential hypertension 07/08/2016  . Fibromyalgia   . Fibromyalgia 07/08/2016  . Generalized anxiety disorder 07/08/2016  . GERD (gastroesophageal reflux disease)   . Hyperlipemia   . Hypertension   . IBS (irritable bowel syndrome)   . Migraines 07/08/2016  . Osteoporosis   . Sleep apnea    wears c-pap    Patient Active Problem List   Diagnosis Date Noted  . Chronic pain syndrome 07/08/2016  . Obstructive sleep apnea 07/08/2016  . Essential hypertension 07/08/2016  . Migraines 07/08/2016  . Generalized anxiety disorder 07/08/2016  . Acute appendicitis 07/07/2016  . Depression 03/15/2012  . 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 ABDOMINAL HYSTERECTOMY    . cervical neck fusion    . CHOLECYSTECTOMY  2003  . csc    . ELBOW SURGERY Right   . LAPAROSCOPIC APPENDECTOMY N/A 07/07/2016   Procedure: APPENDECTOMY LAPAROSCOPIC;  Surgeon: Armandina Gemma, MD;  Location: [facility] ORS;  Service: General;  Laterality: N/A;  . SHOULDER SURGERY Right   . SHOULDER SURGERY Left   . uterous procedure     "burned cells in uterous" per pt    OB History    No data available       Home Medications    Prior to Admission medications   Medication Sig Start Date End Date Taking? Authorizing Provider  acetaminophen (TYLENOL) 325 MG tablet Take 2 tablets (650 mg total) by mouth every 6 (six) hours as needed for mild pain (or temp > 100). 07/08/16  Yes Earnstine Regal, PA-C  albuterol (PROVENTIL) (2.5 MG/3ML) 0.083% nebulizer solution Take 2.5 mg by nebulization every 6 (six) hours as needed for wheezing or shortness of breath.   Yes Historical Provider, MD  atorvastatin (LIPITOR) 20 MG tablet Take 20 mg by mouth daily.   Yes Historical Provider, MD  clonazePAM (KLONOPIN) 2 MG tablet Take 2 mg by mouth 2 (two) times daily.   Yes Historical Provider, MD  cyclobenzaprine (FLEXERIL) 10 MG  tablet Take 1 tablet by mouth 2 (two) times daily. 07/06/16  Yes Historical Provider, MD  dexlansoprazole (DEXILANT) 60 MG capsule Take 60 mg by mouth 2 (two) times daily at 10 AM and 5 PM.   Yes Historical Provider, MD  dicyclomine (BENTYL) 20 MG tablet Take 20 mg by mouth every 6 (six) hours.   Yes Historical Provider, MD  levalbuterol [facility] HFA) 45 MCG/ACT inhaler Inhale 2 puffs into the lungs every 6 (six) hours as needed for wheezing.   Yes Historical Provider, MD  linaclotide (LINZESS) 290 MCG CAPS capsule Take 290 mcg by mouth daily before breakfast.   Yes Historical Provider, MD  lisinopril (PRINIVIL,ZESTRIL) 10 MG tablet Hold on this medicine till her BP is more than 130  top number and over 70 bottom number.  130/70.   If the top number is less than 95, call your Primary care doctor and see him. 07/08/16  Yes Earnstine Regal, PA-C  montelukast (SINGULAIR) 10 MG tablet Take 10 mg by mouth daily as needed. allergies   Yes Historical Provider, MD  morphine (MS CONTIN) 30 MG 12 hr tablet Take 30 mg by mouth every 12 (twelve) hours.   Yes Historical Provider, MD  Olopatadine HCl (PATADAY) 0.2 % SOLN Apply to eye.   Yes Historical Provider, MD  ondansetron (ZOFRAN) 8 MG tablet Take by mouth every 8 (eight) hours as needed for nausea or vomiting.   Yes Historical Provider, MD  oxyCODONE (OXY IR/ROXICODONE) 5 MG immediate release tablet Take 1-2 tablets (5-10 mg total) by mouth every 4 (four) hours as needed for moderate pain. 07/08/16  Yes Earnstine Regal, PA-C  polyethylene glycol (MIRALAX / GLYCOLAX) packet Take 17 g by mouth daily.   Yes Historical Provider, MD  QUEtiapine (SEROQUEL) 50 MG tablet Take 25 mg by mouth at bedtime.   Yes Historical Provider, MD  sucralfate (CARAFATE) 1 GM/10ML suspension Take 1 g by mouth 2 (two) times daily.   Yes Historical Provider, MD  topiramate (TOPAMAX) 50 MG tablet Take 50 mg by mouth daily.   Yes Historical Provider, MD  Vortioxetine HBr (BRINTELLIX) 10 MG TABS Take 1 tablet by mouth daily.    Yes Historical Provider, MD    Family History Family History  Problem Relation Age of Onset  . Colon cancer Mother     58's  . Colon polyps Mother   . Diabetes Mother   . Heart disease Mother   . Pancreatic cancer Mother   . Irritable bowel syndrome Sister   . Renal Disease Sister   . Heart disease Father   . Kidney cancer Sister   . Breast cancer Cousin     1st cousin maternal  . Pancreatic cancer Cousin     1st cousin maternal  . Esophageal cancer Neg Hx   . Rectal cancer Neg Hx   . Stomach cancer Neg Hx     Social History Social History  Substance Use Topics  . Smoking status: Former Smoker    Packs/day: 3.00     Years: 20.00    Types: Cigarettes    Quit date: 12/26/1989  . Smokeless tobacco: Never Used  . Alcohol use No     Allergies   Macrobid [nitrofurantoin]; Paxil [paroxetine hcl]; Avelox [moxifloxacin hcl in nacl]; Erythromycin; Levaquin [levofloxacin]; Ultram [tramadol]; and Vicodin [hydrocodone-acetaminophen]   Review of Systems Review of Systems  All other systems reviewed and are negative.    Physical Exam Updated Vital Signs BP 141/63   Pulse 62  Resp 16   Ht 5\' 4"  (1.626 m)   Wt 164 lb (74.4 kg)   SpO2 92%   BMI 28.15 kg/m   Physical Exam  Constitutional: She is oriented to person, place, and time. She appears well-developed and well-nourished. No distress.  HENT:  Head: Normocephalic and atraumatic.  Eyes: EOM are normal.  Neck: Normal range of motion.  Cardiovascular: Normal rate, regular rhythm and normal heart sounds.   Pulmonary/Chest: Effort normal and breath sounds normal.  Abdominal: Soft.  Mild generalized abdominal tenderness without guarding or rebound.  No peritoneal signs.  Laparoscopic incisions without secondary signs of infection.  Small amount of ecchymosis noted.  Musculoskeletal: Normal range of motion.  Neurological: She is alert and oriented to person, place, and time.  Skin: Skin is warm and dry.  Psychiatric: She has a normal mood and affect. Judgment normal.  Nursing note and vitals reviewed.    ED Treatments / Results  Labs (all labs ordered are listed, but only abnormal results are displayed) Labs Reviewed - No data to display  EKG  EKG Interpretation None       Radiology Ct Angio Chest Pe W And/or Wo Contrast  Result Date: 07/21/2016 CLINICAL DATA:  56 year old female with chest pain and elevated D-dimer. EXAM: CT ANGIOGRAPHY CHEST WITH CONTRAST TECHNIQUE: Multidetector CT imaging of the chest was performed using the standard protocol during bolus administration of intravenous contrast. Multiplanar CT image reconstructions and  MIPs were obtained to evaluate the vascular anatomy. CONTRAST:  100 cc intravenous Isovue 370 COMPARISON:  09/22/2012 CT.  07/20/2016 and prior chest radiographs FINDINGS: Cardiovascular: Satisfactory opacification of the pulmonary arteries to the segmental level. No evidence of pulmonary embolism. Cardiomegaly identified. No pericardial effusion. Mediastinum/Nodes: No enlarged mediastinal, hilar, or axillary lymph nodes. Thyroid gland, trachea, and esophagus demonstrate no significant findings. Lungs/Pleura: Mild bilateral ground-glass opacities may represent mild edema. There is no evidence of airspace disease, nodule, mass, consolidation, pleural effusion or pneumothorax. Upper Abdomen: Splenomegaly noted.  No acute abnormality. Musculoskeletal: No acute or suspicious abnormality. Review of the MIP images confirms the above findings. IMPRESSION: No evidence of pulmonary emboli. Mild ground-glass opacities which may represent mild interstitial edema. Cardiomegaly. Splenomegaly. Electronically Signed   By: Margarette Canada M.D.   On: 07/21/2016 15:33   Ct Abdomen Pelvis W Contrast  Result Date: 07/21/2016 CLINICAL DATA:  Right side abdominal pain, status post appendectomy 07/07/2016 EXAM: CT ABDOMEN AND PELVIS WITH CONTRAST TECHNIQUE: Multidetector CT imaging of the abdomen and pelvis was performed using the standard protocol following bolus administration of intravenous contrast. CONTRAST:  1100mL ISOVUE-300 IOPAMIDOL (ISOVUE-300) INJECTION 61% COMPARISON:  CT scan 07/06/2016 FINDINGS: Lower chest: Lung bases shows no acute findings. Hepatobiliary: The patient is status post cholecystectomy. Minimal fatty infiltration of the liver. No focal hepatic mass. Pancreas: Again noted mild atrophic pancreas without focal abnormality. Spleen: Enhanced spleen with normal appearance. Adrenals/Urinary Tract: No adrenal gland mass. Enhanced kidneys are symmetrical in size. No hydronephrosis or hydroureter. Delayed renal images  shows bilateral renal symmetrical excretion. The urinary bladder is unremarkable. Stomach/Bowel: There is no gastric outlet obstruction. Mild distended small bowel loops with some gas and air-fluid levels in mid lower abdomen and right anterior abdomen please see axial image 56. Findings suspicious for ileus early bowel obstruction or less likely enteritis. Although most of the terminal ileum is collapsed small caliber there is some gas and most distal terminal ileum axial image 56. The patient is status post recent appendectomy. There is some scarring  and postsurgical changes in right lower quadrant. No pericolonic abscess or confluent fluid collection. No evidence of colitis or diverticulitis. There is some stool within distal left colon and sigmoid colon. No distal colonic obstruction. Vascular/Lymphatic: No aortic aneurysm. Minimal atherosclerotic calcifications of distal abdominal aorta. No adenopathy. Mild atherosclerotic calcifications left common iliac artery. Reproductive: The patient is status post hysterectomy. Post tubal ligation surgical clips are noted. Other: No ascites or free abdominal air.  No inguinal adenopathy. Musculoskeletal: No destructive bony lesions are noted. Sagittal images of the spine shows mild degenerative changes thoracolumbar spine. Significant disc space flattening with mild anterior spurring and vacuum disc phenomenon and minimal posterior disc bulge at L1-L2 level. IMPRESSION: 1. There are mild distended small bowel loops in mid lower abdomen and right anterior lower abdomen with some air-fluid levels. There is transition point in caliber of small bowel in axial image 54. Findings suspicious for early bowel obstruction or significant ileus. Less likely enteritis. Clinical correlation is necessary. 2. There are postsurgical changes post recent appendectomy in right lower quadrant. No pericecal confluent fluid collection. No mesenteric fluid collection. No pericecal abscess 3. No  hydronephrosis or hydroureter. Bilateral renal symmetrical excretion. 4. Again noted atrophic pancreas without focal abnormality. 5. Status post hysterectomy. 6. No distal colonic obstruction. No evidence of colitis or diverticulitis. Moderate stool noted in distal colon. 7. Mild degenerative changes thoracolumbar spine most significant at L1-L2 level. Electronically Signed   By: Lahoma Crocker M.D.   On: 07/21/2016 09:52    Procedures Procedures (including critical care time)  Medications Ordered in ED Medications  HYDROmorphone (DILAUDID) injection 1 mg (1 mg Intravenous Given 07/21/16 0916)  ondansetron (ZOFRAN) injection 4 mg (4 mg Intravenous Given 07/21/16 0913)  iopamidol (ISOVUE-300) 61 % injection (100 mLs  Contrast Given 07/21/16 0919)     Initial Impression / Assessment and Plan / ED Course  I have reviewed the triage vital signs and the nursing notes.  Pertinent labs & imaging results that were available during my care of the patient were reviewed by me and considered in my medical decision making (see chart for details).     My suspicion for pulmonary embolism is very low.  Her complaints and be focused to the mid and lower abdomen.  CT abdomen and pelvis performed which demonstrates possible early small bowel obstruction.  Given the patient's discomfort and NG tube will be placed at this time and general surgery will be consult in for further evaluation.  No signs of abdominal abscess or other postoperative complication noted.  3:58 PM CT Advantage of the chest negative for PE or other acute pathology.  General surgery saw and evaluated the patient including Will Creig Hines PA and Dr. Greer Pickerel.  At this time this is thought to be constipation and likely ileus.  They feels that the patient is safe for discharge home from an intra-abdominal standpoint.  Please see consultation note for complete details.  Patient tolerating oral fluids at this time.  Patient has a component of chronic  pain as well.  She's been without her MS Contin today.  She will receive a single additional dose of IV pain medication at this time for pain.  Much of this is now also chronic pain.  She understands return to the ER for new or worsening symptoms.  She has pain medication at home.  She is instructed to follow-up with general surgery and her primary care physician.  Patient and family agreeable.  All questions answered  Final  Clinical Impressions(s) / ED Diagnoses   Final diagnoses:  Abdominal pain, unspecified abdominal location  Ileus [facility])  Pleuritic chest pain    New Prescriptions New Prescriptions   DOCUSATE SODIUM (COLACE) 100 MG CAPSULE    Take 1 capsule (100 mg total) by mouth every 12 (twelve) hours.   POLYETHYLENE GLYCOL POWDER (MIRALAX) POWDER    Take 17 g by mouth daily.     Jola Schmidt, MD 07/21/16 [place], MD 07/21/16 [phone]

## 2016-07-21 NOTE — ED Notes (Signed)
Patient ambulated to restroom.  Gait steady and even.   
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
## 2016-07-21 NOTE — ED Notes (Signed)
Patient transported to CT 
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
## 2017-04-28 ENCOUNTER — Encounter: Payer: Self-pay | Admitting: Gastroenterology

## 2017-04-28 ENCOUNTER — Ambulatory Visit (INDEPENDENT_AMBULATORY_CARE_PROVIDER_SITE_OTHER): Payer: Medicare Other | Admitting: Gastroenterology

## 2017-04-28 VITALS — BP 130/72 | HR 76 | Ht 61.0 in | Wt 176.0 lb

## 2017-04-28 DIAGNOSIS — K5900 Constipation, unspecified: Secondary | ICD-10-CM

## 2017-04-28 DIAGNOSIS — R1084 Generalized abdominal pain: Secondary | ICD-10-CM | POA: Diagnosis not present

## 2017-04-28 DIAGNOSIS — R112 Nausea with vomiting, unspecified: Secondary | ICD-10-CM

## 2017-04-28 DIAGNOSIS — R110 Nausea: Secondary | ICD-10-CM

## 2017-04-28 NOTE — Patient Instructions (Addendum)
If you are age 55 or older, your body mass index should be between 23-30. Your Body mass index is 33.25 kg/m. If this is out of the aforementioned range listed, please consider follow up with your Primary Care Provider.  If you are age 63 or younger, your body mass index should be between 19-25. Your Body mass index is 33.25 kg/m. If this is out of the aformentioned range listed, please consider follow up with your Primary Care Provider.   Please use the Suprep at this evening and one in the morning.   Thank you for choosing [facility]  Dr Wilfrid Lund III

## 2017-04-28 NOTE — Progress Notes (Signed)
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 She has had no improvement on MiraLAX or Linzess. After a visit to year ago she tried Movantik, but reports he just gave her severe cramps. She has been bothered by worsened symptoms lately of generalized abdominal pain with bloating and constipation that she feels is making her short of breath. She has nausea but no vomiting. Amber Harrell reports no bowel movement in about 2 weeks. She denies aspirin or NSAID use. She has had a previous cholecystectomy, and had an appendectomy in January of this year.  No dysuria, fever or weight loss  The patient's Past Medical, Family and Social History were reviewed and are on file in the EMR.  Objective:  Med list reviewed  Current Outpatient Medications:  .  acetaminophen (TYLENOL) 325 MG tablet, Take 2 tablets (650 mg total) by mouth every 6 (six) hours as needed for mild pain (or temp > 100)., Disp: , Rfl:  .  albuterol (PROVENTIL) (2.5 MG/3ML) 0.083% nebulizer solution, Take 2.5 mg by nebulization every 6 (six) hours as needed for wheezing or shortness of breath., Disp: , Rfl:  .  atorvastatin (LIPITOR) 20 MG tablet, Take 20 mg by mouth daily., Disp: , Rfl:  .  clonazePAM (KLONOPIN) 2 MG tablet, Take 2 mg by mouth 2 (two) times daily., Disp: , Rfl:  .  cyclobenzaprine (FLEXERIL) 10 MG tablet, Take 1 tablet by mouth 2 (two) times daily., Disp: , Rfl:  .  dexlansoprazole (DEXILANT) 60 MG capsule, Take 60 mg by mouth 2 (two) times daily at 10 AM and 5 PM., Disp: , Rfl:  .  docusate sodium (COLACE) 100 MG capsule, Take 1 capsule (100 mg total) by mouth every 12 (twelve) hours., Disp: 60 capsule, Rfl: 0 .  levalbuterol (XOPENEX HFA) 45 MCG/ACT inhaler, Inhale 2 puffs into the lungs every 6 (six) hours as needed for wheezing.,  Disp: , Rfl:  .  linaclotide (LINZESS) 290 MCG CAPS capsule, Take 290 mcg by mouth daily before breakfast., Disp: , Rfl:  .  lisinopril (PRINIVIL,ZESTRIL) 10 MG tablet, Hold on this medicine till her BP is more than 130 top number and over 70 bottom number.  130/70.   If the top number is less than 95, call your Primary care doctor and see him., Disp: , Rfl:  .  montelukast (SINGULAIR) 10 MG tablet, Take 10 mg by mouth daily as needed. allergies, Disp: , Rfl:  .  morphine (MS CONTIN) 15 MG 12 hr tablet, Take 15 mg every 12 (twelve) hours by mouth., Disp: , Rfl:  .  Olopatadine HCl (PATADAY) 0.2 % SOLN, Apply to eye., Disp: , Rfl:  .  ondansetron (ZOFRAN) 8 MG tablet, Take by mouth every 8 (eight) hours as needed for nausea or vomiting., Disp: , Rfl:  .  polyethylene glycol powder (MIRALAX) powder, Take 17 g by mouth daily., Disp: 255 g, Rfl: 0 .  QUEtiapine (SEROQUEL) 50 MG tablet, Take 25 mg by mouth at bedtime., Disp: , Rfl:  .  sucralfate (CARAFATE) 1 GM/10ML suspension, Take 1 g by mouth 2 (two) times daily., Disp: , Rfl:  .  SUMAtriptan (IMITREX) 50 MG tablet, Take 50 mg 2 (two) times daily as needed by mouth for migraine. May repeat in 2 hours if headache persists or recurs., Disp: , Rfl:  .  topiramate (TOPAMAX) 50 MG tablet, Take 50 mg by mouth daily., Disp: , Rfl:  .  Vortioxetine HBr (BRINTELLIX) 10 MG TABS, Take 1 tablet by mouth daily. , Disp: , Rfl:    Vital signs in last 24 hrs: Vitals:   04/28/17 1103  BP: 130/72  Pulse: 76    Physical Exam  Anxious appearing woman, uncomfortable  HEENT: sclera anicteric, oral mucosa moist without lesions  Neck: supple, no thyromegaly, JVD or lymphadenopathy  Cardiac: RRR without murmurs, S1S2 heard, no peripheral edema  Pulm: clear to auscultation bilaterally, normal RR and effort noted  Abdomen: soft, mild diffuse tenderness to light palpation of the abdominal wall, with normal and active bowel sounds. No guarding or palpable  hepatosplenomegaly.  Skin; warm and dry, no jaundice or rash  No recent data for review  @ASSESSMENTPLANBEGIN @ Assessment: Encounter Diagnoses  Name Primary?  . Obstipation Yes  . Generalized abdominal pain   . Nausea without vomiting    She seems to have a recent acute worsening of her chronic constipation that is still feel is opioid related. She has chronic pain syndrome and affect of disorder with symptoms consistent with narcotic bowel syndrome. We have had another discussion about the chronic nature of this condition and the limited available therapies. Her exam is not consistent with a bowel obstruction, she is not vomiting and she is passing gas.   Plan: We gave her a sample of a bowel prep to take half today and half tomorrow for some relief of this constipation. After that, she can use a over-the-counter mix of MiraLAX and Gatorade to take every 7-10 days to get at least temporary relief of constipation. I am afraid I do not have anything else to offer for that. I have asked her to discontinue the Linzess since it does not seem to be helping. Even with her reported decrease in the opioid dose, afraid this will likely continue to be a long-term issue for her. Total time 20 minutes, over half spent in counseling and coordination of care.   Amber Harrell

## 2017-04-29 ENCOUNTER — Telehealth: Payer: Self-pay | Admitting: Gastroenterology

## 2017-04-29 NOTE — Telephone Encounter (Signed)
Pt advised she can eat solids as tolertated

## 2017-05-16 ENCOUNTER — Telehealth: Payer: Self-pay | Admitting: Gastroenterology

## 2017-05-16 NOTE — Telephone Encounter (Signed)
Patient still having abdominal, gas pain and bloating especially after eating. She has nausea, takes zofran to help with that, one episode of vomiting. She is taking her Miralax and is having bowel movements.

## 2017-05-17 NOTE — Telephone Encounter (Signed)
Left message for patient to call back  

## 2017-05-17 NOTE — Telephone Encounter (Signed)
Sorry to hear she is not feeling well.  We discussed this at recent visit - I believe this is all from chronic opioid meds and I have little else to offer.  See my recent note.  Recommend 238 gram bottle of generic miralax in 2 liters gatorade , drink half one day and half the next for relief of constipation.  Can do this every 2 weeks if needed. No other therapy to offer.

## 2017-05-19 NOTE — Telephone Encounter (Signed)
Tried to reach patient again today, no answer. I have mailed her a letter with Dr. Loletha Carrow' response.

## 2018-02-07 ENCOUNTER — Encounter: Payer: Medicare Other | Admitting: Neurology

## 2018-02-07 ENCOUNTER — Telehealth: Payer: Self-pay | Admitting: Neurology

## 2018-02-07 NOTE — Telephone Encounter (Signed)
This patient canceled the same day of her EMG and nerve conduction study evaluation.

## 2018-02-08 ENCOUNTER — Encounter: Payer: Self-pay | Admitting: Neurology

## 2018-03-08 ENCOUNTER — Telehealth: Payer: Self-pay | Admitting: Neurology

## 2018-03-08 ENCOUNTER — Encounter: Payer: Medicare Other | Admitting: Neurology

## 2018-03-08 NOTE — Telephone Encounter (Signed)
This patient canceled the same day of the EMG nerve conduction study appointment.  This is the second occurrence, the patient will be discharged from our practice.

## 2018-03-09 ENCOUNTER — Telehealth: Payer: Self-pay | Admitting: Neurology

## 2018-03-09 NOTE — Telephone Encounter (Signed)
I have already canceled the EMG appointment for 24 September, the patient has missed to EMG and nerve conduction study appointments, thereby missing a total of 4 appointments, one each with me and with the nerve conduction technician.

## 2018-03-09 NOTE — Telephone Encounter (Signed)
[name] is a New Patient and has no-showed twice for scheduled NCV/EMG.

## 2018-03-13 ENCOUNTER — Encounter: Payer: Self-pay | Admitting: Neurology

## 2018-03-14 ENCOUNTER — Encounter: Payer: Medicare Other | Admitting: Neurology

## 2018-11-13 ENCOUNTER — Encounter: Payer: Self-pay | Admitting: Gastroenterology

## 2019-09-20 DIAGNOSIS — R0600 Dyspnea, unspecified: Secondary | ICD-10-CM

## 2020-11-30 HISTORY — PX: OTHER SURGICAL HISTORY: SHX169

## 2021-04-10 ENCOUNTER — Telehealth: Payer: Self-pay | Admitting: Gastroenterology

## 2021-04-10 NOTE — Telephone Encounter (Signed)
Good afternoon Dr. Loletha Carrow, patient called stating she would like to transfer care to Dr. Ardis Hughs since one of her friends sees him.  Are you ok with this transfer?

## 2021-04-12 NOTE — Telephone Encounter (Signed)
Yes, but Dr. Ardis Hughs would have to be agreeable.  When last seen in 2018, I had little else to offer for her opioid-induced constipation.  - HD

## 2021-04-13 NOTE — Telephone Encounter (Signed)
Thank you both.  Tried calling patient but could not leave voicemail; mailbox not setup.

## 2021-04-13 NOTE — Telephone Encounter (Signed)
Dr. Ardis Hughs do you agree with transfer?

## 2021-10-26 ENCOUNTER — Encounter: Payer: Self-pay | Admitting: Gastroenterology

## 2021-11-18 ENCOUNTER — Ambulatory Visit: Payer: Medicare Other | Admitting: Gastroenterology

## 2021-11-18 NOTE — Progress Notes (Deleted)
    Blood work 2018 LFTs were all normal

## 2022-01-06 ENCOUNTER — Encounter: Payer: Self-pay | Admitting: Gastroenterology

## 2022-01-06 ENCOUNTER — Ambulatory Visit: Payer: Medicare Other | Admitting: Gastroenterology

## 2022-01-06 ENCOUNTER — Other Ambulatory Visit (INDEPENDENT_AMBULATORY_CARE_PROVIDER_SITE_OTHER): Payer: Medicare Other

## 2022-01-06 DIAGNOSIS — Z86010 Personal history of colonic polyps: Secondary | ICD-10-CM | POA: Diagnosis not present

## 2022-01-06 DIAGNOSIS — R7989 Other specified abnormal findings of blood chemistry: Secondary | ICD-10-CM | POA: Diagnosis not present

## 2022-01-06 DIAGNOSIS — K5900 Constipation, unspecified: Secondary | ICD-10-CM

## 2022-01-06 LAB — HEPATIC FUNCTION PANEL
ALT: 24 U/L (ref 0–35)
AST: 19 U/L (ref 0–37)
Albumin: 4.3 g/dL (ref 3.5–5.2)
Alkaline Phosphatase: 117 U/L (ref 39–117)
Bilirubin, Direct: 0.1 mg/dL (ref 0.0–0.3)
Total Bilirubin: 0.5 mg/dL (ref 0.2–1.2)
Total Protein: 7.5 g/dL (ref 6.0–8.3)

## 2022-01-06 LAB — FERRITIN: Ferritin: 52.9 ng/mL (ref 10.0–291.0)

## 2022-01-06 LAB — PROTIME-INR
INR: 1 ratio (ref 0.8–1.0)
Prothrombin Time: 11 s (ref 9.6–13.1)

## 2022-01-06 MED ORDER — NA SULFATE-K SULFATE-MG SULF 17.5-3.13-1.6 GM/177ML PO SOLN: 1.0000 | ORAL | 0 refills | Status: DC

## 2022-01-06 NOTE — Patient Instructions (Signed)
If you are age 61 or older, your body mass index should be between 23-30. Your Body mass index is 33.59 kg/m. If this is out of the aforementioned range listed, please consider follow up with your Primary Care Provider.  If you are age 47 or younger, your body mass index should be between 19-25. Your Body mass index is 33.59 kg/m. If this is out of the aformentioned range listed, please consider follow up with your Primary Care Provider.   ________________________________________________________  The [facility] providers would like to encourage you to use [facility] to communicate with providers for non-urgent requests or questions.  Due to long hold times on the telephone, sending your provider a message by [facility] may be a faster and more efficient way to get a response.  Please allow 48 business hours for a response.  Please remember that this is for non-urgent requests.  _______________________________________________________  Your provider has requested that you go to the basement level for lab work before leaving today. Press "B" on the elevator. The lab is located at the first door on the left as you exit the elevator.  You have been scheduled for an abdominal ultrasound at [facility] (1st floor of hospital) on 01-11-22 at 10:30am. Please arrive 30 minutes prior to your appointment for registration. Make certain not to have anything to eat or drink 6 hours prior to your appointment. Should you need to reschedule your appointment, please contact radiology at [phone]. This test typically takes about 30 minutes to perform.  You have been scheduled for a colonoscopy. Please follow written instructions given to you at your visit today.  Please pick up your prep supplies at the pharmacy within the next 1-3 days. If you use inhalers (even only as needed), please bring them with you on the day of your procedure.  Due to recent changes in healthcare laws, you may see the results of your  imaging and laboratory studies on MyChart before your provider has had a chance to review them.  We understand that in some cases there may be results that are confusing or concerning to you. Not all laboratory results come back in the same time frame and the provider may be waiting for multiple results in order to interpret others.  Please give [place] 48 hours in order for your provider to thoroughly review all the results before contacting the office for clarification of your results.   Please purchase the following medications over the counter and take as directed:  Miralax daily.  Thank you for entrusting me with your care and choosing [facility].  Dr Ardis Hughs
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
## 2022-01-11 ENCOUNTER — Ambulatory Visit (HOSPITAL_COMMUNITY)
Admission: RE | Admit: 2022-01-11 | Discharge: 2022-01-11 | Disposition: A | Discharge status: Home / Self Care | Payer: Medicare Other | Source: Ambulatory Visit | Attending: Gastroenterology | Admitting: Gastroenterology

## 2022-01-11 DIAGNOSIS — R7989 Other specified abnormal findings of blood chemistry: Secondary | ICD-10-CM | POA: Insufficient documentation

## 2022-01-13 LAB — HEPATITIS A ANTIBODY, TOTAL: Hepatitis A AB,Total: NONREACTIVE

## 2022-01-13 LAB — TISSUE TRANSGLUTAMINASE, IGA: (tTG) Ab, IgA: <1 U/mL

## 2022-01-13 LAB — ANTI-NUCLEAR AB-TITER (ANA TITER)
ANA TITER: 1:40 {titer} — ABNORMAL HIGH
ANA Titer 1: 1:40 {titer} — ABNORMAL HIGH

## 2022-01-13 LAB — HEPATITIS B SURFACE ANTIGEN: Hepatitis B Surface Ag: NONREACTIVE

## 2022-01-13 LAB — ALPHA-1-ANTITRYPSIN: A-1 Antitrypsin, Ser: 141 mg/dL (ref 83–199)

## 2022-01-13 LAB — ANA: Anti Nuclear Antibody (ANA): POSITIVE — AB

## 2022-01-13 LAB — IGA: Immunoglobulin A: 273 mg/dL (ref 70–320)

## 2022-01-13 LAB — ANTI-SMOOTH MUSCLE ANTIBODY, IGG: Actin (Smooth Muscle) Antibody (IGG): <20 U (ref <20)

## 2022-01-13 LAB — HEPATITIS C ANTIBODY: Hepatitis C Ab: NONREACTIVE

## 2022-01-13 LAB — MITOCHONDRIAL ANTIBODIES: Mitochondrial M2 Ab, IgG: <=20 U (ref <=20.0)

## 2022-01-13 LAB — CERULOPLASMIN: Ceruloplasmin: 28 mg/dL (ref 18–53)

## 2022-01-13 LAB — HEPATITIS B SURFACE ANTIBODY,QUALITATIVE: Hep B S Ab: NONREACTIVE

## 2022-02-23 ENCOUNTER — Encounter: Payer: Self-pay | Admitting: Gastroenterology

## 2022-03-05 ENCOUNTER — Encounter: Payer: Medicare Other | Admitting: Gastroenterology

## 2022-03-26 ENCOUNTER — Telehealth: Payer: Self-pay | Admitting: Gastroenterology

## 2022-03-26 NOTE — Telephone Encounter (Signed)
The pt is returning call to confirm appt with Dr Rush Landmark in Dr Ardis Hughs absence.  We discussed her appt and instructions.  She thanked me for calling and will let us know if she has any further questions.

## 2022-03-26 NOTE — Telephone Encounter (Signed)
Inbound call from patient stating she is having complications swallowing . Patient is scheduled for a coon procedure 10/24.   Initially patient is a Dr Amber Harrell patient.  Please advise on scheduling.  Thank you

## 2022-03-31 ENCOUNTER — Encounter: Payer: Medicare Other | Admitting: Gastroenterology

## 2022-04-13 ENCOUNTER — Encounter: Payer: Self-pay | Admitting: Gastroenterology

## 2022-04-13 ENCOUNTER — Ambulatory Visit (AMBULATORY_SURGERY_CENTER): Payer: Medicare Other | Admitting: Gastroenterology

## 2022-04-13 VITALS — BP 122/72 | HR 73 | Temp 97.1°F | Resp 13 | Ht 60.0 in | Wt 172.0 lb

## 2022-04-13 DIAGNOSIS — D120 Benign neoplasm of cecum: Secondary | ICD-10-CM | POA: Diagnosis not present

## 2022-04-13 DIAGNOSIS — K639 Disease of intestine, unspecified: Secondary | ICD-10-CM | POA: Diagnosis not present

## 2022-04-13 DIAGNOSIS — K5900 Constipation, unspecified: Secondary | ICD-10-CM

## 2022-04-13 DIAGNOSIS — Z09 Encounter for follow-up examination after completed treatment for conditions other than malignant neoplasm: Secondary | ICD-10-CM

## 2022-04-13 DIAGNOSIS — Z86010 Personal history of colonic polyps: Secondary | ICD-10-CM

## 2022-04-13 DIAGNOSIS — D123 Benign neoplasm of transverse colon: Secondary | ICD-10-CM

## 2022-04-13 MED ORDER — SODIUM CHLORIDE 0.9 % IV SOLN: 500.0000 mL | Freq: Once | INTRAVENOUS | Status: DC

## 2022-04-13 NOTE — Progress Notes (Signed)
Report to PACU, RN, vss, BBS= Clear.  

## 2022-04-13 NOTE — Progress Notes (Signed)
Called to room to assist during endoscopic procedure.  Patient ID and intended procedure confirmed with present staff. Received instructions for my participation in the procedure from the performing physician.  

## 2022-04-13 NOTE — Op Note (Signed)
[facility] [facility] Patient Name: Amber Harrell Procedure Date: 04/13/2022 9:01 AM MRN: [id number] Endoscopist: Justice Britain , MD Age: 61 Referring MD:  Date of Birth: February 16, 1961 Gender: Female Account #: [id number] Procedure:                Colonoscopy Indications:              Surveillance: Personal history of adenomatous                            polyps on last colonoscopy > 5 years ago Medicines:                Monitored Anesthesia Care Procedure:                Pre-Anesthesia Assessment:                           - Prior to the procedure, a History and Physical                            was performed, and patient medications and                            allergies were reviewed. The patient's tolerance of                            previous anesthesia was also reviewed. The risks                            and benefits of the procedure and the sedation                            options and risks were discussed with the patient.                            All questions were answered, and informed consent                            was obtained. Prior Anticoagulants: The patient has                            taken no previous anticoagulant or antiplatelet                            agents. ASA Grade Assessment: III - A patient with                            severe systemic disease. After reviewing the risks                            and benefits, the patient was deemed in                            satisfactory condition to undergo the procedure.  After obtaining informed consent, the colonoscope                            was passed under direct vision. Throughout the                            procedure, the patient's blood pressure, pulse, and                            oxygen saturations were monitored continuously. The                            Colonoscope was introduced through the anus and                            advanced to the 5 cm  into the ileum. The                            colonoscopy was performed without difficulty. The                            patient tolerated the procedure. The quality of the                            bowel preparation was adequate. The terminal ileum,                            ileocecal valve, appendiceal orifice, and rectum                            were photographed. Scope In: 9:13:58 AM Scope Out: 9:32:41 AM Scope Withdrawal Time: 0 hours 15 minutes 20 seconds  Total Procedure Duration: 0 hours 18 minutes 43 seconds  Findings:                 The digital rectal exam findings include                            hemorrhoids. Pertinent negatives include no                            palpable rectal lesions.                           The terminal ileum appeared normal.                           The ileocecal valve was granular in appearance.                            Biopsies were taken with a cold forceps for                            histology to rule out dysplasia.  Three sessile polyps were found in the hepatic                            flexure (2) and cecum (1). The polyps were 4 to 10                            mm in size. These polyps were removed with a cold                            snare. Resection and retrieval were complete.                           Normal mucosa was found in the entire colon                            otherwise.                           Non-bleeding non-thrombosed external and internal                            hemorrhoids were found during retroflexion, during                            perianal exam and during digital exam. The                            hemorrhoids were Grade II (internal hemorrhoids                            that prolapse but reduce spontaneously). Complications:            No immediate complications. Estimated Blood Loss:     Estimated blood loss was minimal. Impression:               - Hemorrhoids  found on digital rectal exam.                           - The examined portion of the terminal ileum was                            normal.                           - Granularity at the ileocecal valve - biopsied to                            rule out dysplasia.                           - Three 4 to 10 mm polyps at the hepatic flexure                            and in the cecum, removed with a cold snare.  Resected and retrieved.                           - Normal mucosa in the entire examined colon                            otherwise.                           - Non-bleeding non-thrombosed external and internal                            hemorrhoids. Recommendation:           - The patient will be observed post-procedure,                            until all discharge criteria are met.                           - Discharge patient to home.                           - Patient has a contact number available for                            emergencies. The signs and symptoms of potential                            delayed complications were discussed with the                            patient. Return to normal activities tomorrow.                            Written discharge instructions were provided to the                            patient.                           - High fiber diet.                           - Use FiberCon 1-2 tablets PO daily.                           - Try to use MiraLAX on a daily or every other day                            basis regularly and can use MiraLAX up to 2 times                            daily if needed. If no bowel movement after 3 days  then Dulcolax 10 mg once to twice daily can be                            utilized.                           - We will see if we have Trulance samples in the                            coming weeks to try to get to you so that you can                             use on an as-needed basis, since it is not feasible                            for you to continue this medication otherwise.                           - Continue present medications.                           - Await pathology results.                           - Repeat colonoscopy in 3 years for surveillance.                           - The findings and recommendations were discussed                            with the patient.                           - The findings and recommendations were discussed                            with the patient's family. Justice Britain, MD 04/13/2022 9:40:57 AM

## 2022-04-13 NOTE — Patient Instructions (Addendum)
- High fiber diet.  - Use FiberCon 1-2 tablets PO daily.  - Try to use MiraLAX on a daily or every other day basis regularly and can use MiraLAX up to 2 times daily if needed. If no bowel movement after 3 days then Dulcolax 10 mg once to twice daily can be utilized.  - We will see if we have Trulance samples in the coming weeks to try to get to you so that you can use on an as-needed basis, since it is not feasible for you to continue this medication otherwise.  - Continue present medications.  - Repeat colonoscopy in 3 years for surveillance.  Handouts given for polyps, high fiber diet   YOU HAD AN ENDOSCOPIC PROCEDURE TODAY AT [facility]:   Refer to the procedure report that was given to you for any specific questions about what was found during the examination.  If the procedure report does not answer your questions, please call your gastroenterologist to clarify.  If you requested that your care partner not be given the details of your procedure findings, then the procedure report has been included in a sealed envelope for you to review at your convenience later.  YOU SHOULD EXPECT: Some feelings of bloating in the abdomen. Passage of more gas than usual.  Walking can help get rid of the air that was put into your GI tract during the procedure and reduce the bloating. If you had a lower endoscopy (such as a colonoscopy or flexible sigmoidoscopy) you may notice spotting of blood in your stool or on the toilet paper. If you underwent a bowel prep for your procedure, you may not have a normal bowel movement for a few days.  Please Note:  You might notice some irritation and congestion in your nose or some drainage.  This is from the oxygen used during your procedure.  There is no need for concern and it should clear up in a day or so.  SYMPTOMS TO REPORT IMMEDIATELY:  Following lower endoscopy (colonoscopy):  Excessive amounts of blood in the stool  Significant  tenderness or worsening of abdominal pains  Swelling of the abdomen that is new, acute  Fever of 100F or higher  For urgent or emergent issues, a gastroenterologist can be reached at any hour by calling [phone]. Do not use MyChart messaging for urgent concerns.   DIET:  We do recommend a small meal at first, but then you may proceed to your regular diet.  Drink plenty of fluids but you should avoid alcoholic beverages for 24 hours.  ACTIVITY:  You should plan to take it easy for the rest of today and you should NOT DRIVE or use heavy machinery until tomorrow (because of the sedation medicines used during the test).    FOLLOW UP: Our staff will call the number listed on your records the next business day following your procedure.  We will call around 7:15- 8:00 am to check on you and address any questions or concerns that you may have regarding the information given to you following your procedure. If we do not reach you, we will leave a message.     If any biopsies were taken you will be contacted by phone or by letter within the next 1-3 weeks.  Please call us at [phone] if you have not heard about the biopsies in 3 weeks.    SIGNATURES/CONFIDENTIALITY: You and/or your care partner have signed paperwork which will be entered into your  electronic medical record.  These signatures attest to the fact that that the information above on your After Visit Summary has been reviewed and is understood.  Full responsibility of the confidentiality of this discharge information lies with you and/or your care-partner.
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
## 2022-04-14 ENCOUNTER — Telehealth: Payer: Self-pay

## 2022-04-14 NOTE — Telephone Encounter (Signed)
Left message on follow up call. 

## 2022-04-15 ENCOUNTER — Encounter: Payer: Self-pay | Admitting: Gastroenterology

## 2022-04-21 ENCOUNTER — Telehealth: Payer: Self-pay

## 2022-04-21 NOTE — Telephone Encounter (Signed)
-----   Message from Irving Copas., MD sent at 04/15/2022  9:58 AM EDT ----- Amber Harrell, Her pathology letter will be released by the [facility] nurse but showed tubular adenomas and she will be due for a 3-year colonoscopy recall. If we get any Trulance samples, can we see about saving some for this patient.  She has struggled financially unfortunately and had quite good effectiveness with Trulance.  Also work on setting her up for a follow-up in clinic with me in the next 2 to 3 months to discuss her chronic constipation and potential role of recurring bowel preparations to clean her out. Thanks. GM

## 2022-04-21 NOTE — Telephone Encounter (Signed)
Called patient and left detail message. Samples of Trulance have been placed at front desk for patient to come by and pick -up. I also placed savings card in bag with samples to see if this will help with cost. Patient has also been scheduled for follow-up appt  06/30/22 at 1:30 pm with Dr.Mansouraty.

## 2022-06-30 ENCOUNTER — Ambulatory Visit: Payer: Medicare Other | Admitting: Gastroenterology

## 2022-08-18 ENCOUNTER — Telehealth: Payer: Self-pay | Admitting: Gastroenterology

## 2022-08-18 NOTE — Telephone Encounter (Signed)
PT is having severe stomach pains. Looking for options for relief. Please advise.

## 2022-08-19 NOTE — Telephone Encounter (Signed)
The pt states that she was constipated and she began miralax and began to mover her bowels and the pain resolved.  She will call back if the pain returns.  She will call back to make an appt for follow up. She will use miralax daily and tirtrate as needed

## 2022-09-29 ENCOUNTER — Encounter: Payer: Self-pay | Admitting: Gastroenterology

## 2022-09-29 ENCOUNTER — Ambulatory Visit: Payer: Medicare Other | Admitting: Gastroenterology

## 2022-09-29 ENCOUNTER — Other Ambulatory Visit (INDEPENDENT_AMBULATORY_CARE_PROVIDER_SITE_OTHER): Payer: Medicare Other

## 2022-09-29 VITALS — BP 130/86 | HR 79 | Ht 60.0 in | Wt 157.0 lb

## 2022-09-29 DIAGNOSIS — K921 Melena: Secondary | ICD-10-CM

## 2022-09-29 DIAGNOSIS — K219 Gastro-esophageal reflux disease without esophagitis: Secondary | ICD-10-CM | POA: Diagnosis not present

## 2022-09-29 DIAGNOSIS — R748 Abnormal levels of other serum enzymes: Secondary | ICD-10-CM

## 2022-09-29 DIAGNOSIS — Z86010 Personal history of colonic polyps: Secondary | ICD-10-CM

## 2022-09-29 DIAGNOSIS — R634 Abnormal weight loss: Secondary | ICD-10-CM

## 2022-09-29 DIAGNOSIS — R195 Other fecal abnormalities: Secondary | ICD-10-CM

## 2022-09-29 DIAGNOSIS — K5909 Other constipation: Secondary | ICD-10-CM

## 2022-09-29 DIAGNOSIS — R1012 Left upper quadrant pain: Secondary | ICD-10-CM | POA: Diagnosis not present

## 2022-09-29 LAB — COMPREHENSIVE METABOLIC PANEL
ALT: 13 U/L (ref 0–35)
AST: 20 U/L (ref 0–37)
Albumin: 4.3 g/dL (ref 3.5–5.2)
Alkaline Phosphatase: 80 U/L (ref 39–117)
BUN: 14 mg/dL (ref 6–23)
CO2: 29 mEq/L (ref 19–32)
Calcium: 9.5 mg/dL (ref 8.4–10.5)
Chloride: 101 mEq/L (ref 96–112)
Creatinine, Ser: 1.24 mg/dL — ABNORMAL HIGH (ref 0.40–1.20)
GFR: 46.8 mL/min — ABNORMAL LOW (ref >60.00)
Glucose, Bld: 92 mg/dL (ref 70–99)
Potassium: 3.8 mEq/L (ref 3.5–5.1)
Sodium: 139 mEq/L (ref 135–145)
Total Bilirubin: 1 mg/dL (ref 0.2–1.2)
Total Protein: 7.6 g/dL (ref 6.0–8.3)

## 2022-09-29 LAB — CBC
HCT: 46.6 % — ABNORMAL HIGH (ref 36.0–46.0)
Hemoglobin: 15.4 g/dL — ABNORMAL HIGH (ref 12.0–15.0)
MCHC: 33 g/dL (ref 30.0–36.0)
MCV: 82 fl (ref 78.0–100.0)
Platelets: 158 10*3/uL (ref 150.0–400.0)
RBC: 5.68 Mil/uL — ABNORMAL HIGH (ref 3.87–5.11)
RDW: 14.4 % (ref 11.5–15.5)
WBC: 7.7 10*3/uL (ref 4.0–10.5)

## 2022-09-29 LAB — IBC + FERRITIN
Ferritin: 40.1 ng/mL (ref 10.0–291.0)
Iron: 47 ug/dL (ref 42–145)
Saturation Ratios: 14.7 % — ABNORMAL LOW (ref 20.0–50.0)
TIBC: 320.6 ug/dL (ref 250.0–450.0)
Transferrin: 229 mg/dL (ref 212.0–360.0)

## 2022-09-29 MED ORDER — DEXLANSOPRAZOLE 60 MG PO CPDR: 60.0000 mg | DELAYED_RELEASE_CAPSULE | Freq: Two times a day (BID) | ORAL | 11 refills | Status: AC

## 2022-09-29 MED ORDER — SUCRALFATE 1 G PO TABS: 1.0000 g | ORAL_TABLET | Freq: Two times a day (BID) | ORAL | 3 refills | Status: DC
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
## 2022-09-29 NOTE — Patient Instructions (Signed)
We have sent the following medications to your pharmacy for you to pick up at your convenience: Dexilant , Carafate   Your provider has requested that you go to the basement level for lab work before leaving today. Press "B" on the elevator. The lab is located at the first door on the left as you exit the elevator.  You have been scheduled for an endoscopy. Please follow written instructions given to you at your visit today. If you use inhalers (even only as needed), please bring them with you on the day of your procedure.  _______________________________________________________  If your blood pressure at your visit was 140/90 or greater, please contact your primary care physician to follow up on this.  _______________________________________________________  If you are age 62 or older, your body mass index should be between 23-30. Your Body mass index is 30.66 kg/m. If this is out of the aforementioned range listed, please consider follow up with your Primary Care Provider.  If you are age 71 or younger, your body mass index should be between 19-25. Your Body mass index is 30.66 kg/m. If this is out of the aformentioned range listed, please consider follow up with your Primary Care Provider.   ________________________________________________________  The [facility] providers would like to encourage you to use [facility] to communicate with providers for non-urgent requests or questions.  Due to long hold times on the telephone, sending your provider a message by [facility] may be a faster and more efficient way to get a response.  Please allow 48 business hours for a response.  Please remember that this is for non-urgent requests.  _______________________________________________________  Thank you for choosing me and [facility].  Dr. Meridee Score

## 2022-10-05 ENCOUNTER — Encounter: Payer: Self-pay | Admitting: Gastroenterology

## 2022-10-05 DIAGNOSIS — R634 Abnormal weight loss: Secondary | ICD-10-CM | POA: Insufficient documentation

## 2022-10-05 DIAGNOSIS — R195 Other fecal abnormalities: Secondary | ICD-10-CM | POA: Insufficient documentation

## 2022-10-05 DIAGNOSIS — R748 Abnormal levels of other serum enzymes: Secondary | ICD-10-CM | POA: Insufficient documentation

## 2022-10-05 DIAGNOSIS — R1012 Left upper quadrant pain: Secondary | ICD-10-CM | POA: Insufficient documentation

## 2022-10-05 DIAGNOSIS — K219 Gastro-esophageal reflux disease without esophagitis: Secondary | ICD-10-CM | POA: Insufficient documentation

## 2022-10-05 DIAGNOSIS — K921 Melena: Secondary | ICD-10-CM | POA: Insufficient documentation

## 2022-10-05 DIAGNOSIS — K5909 Other constipation: Secondary | ICD-10-CM | POA: Insufficient documentation

## 2022-10-05 DIAGNOSIS — Z86010 Personal history of colonic polyps: Secondary | ICD-10-CM | POA: Insufficient documentation

## 2022-10-20 ENCOUNTER — Encounter: Payer: Medicare Other | Admitting: Gastroenterology

## 2022-10-20 ENCOUNTER — Telehealth: Payer: Self-pay | Admitting: Gastroenterology

## 2022-10-20 NOTE — Telephone Encounter (Signed)
Hi Dr Meridee Score,   I called patient regarding her procedure appointment today, pt did not respond. I was unable to leave a voice message. I will no show patient for today's appointment.   Thank you

## 2022-10-20 NOTE — Telephone Encounter (Signed)
Thank you for this update. No show as you have outlined. Cancellation fee will need to be pursued. Thanks. GM

## 2022-10-22 ENCOUNTER — Telehealth: Payer: Self-pay | Admitting: Gastroenterology

## 2022-10-22 NOTE — Telephone Encounter (Signed)
Patient called stating she is have a very hard time having a bm. States the medication she was prescribed is not helping. Requesting to speak with nurse to see what she is able to do. Please advise, thank you.

## 2022-10-22 NOTE — Telephone Encounter (Signed)
Left message on machine to call back  

## 2022-10-25 NOTE — Telephone Encounter (Signed)
Left message on machine to call back  

## 2022-10-25 NOTE — Telephone Encounter (Signed)
The pt states that she is constipated and her med is not working. I did ask if she is taking miralax daily as recommended.  She tells me that she is not taking it daily.  I advised that she take 1-2 doses daily but to take 6 doses today as a purge and then call back if she does not get good results

## 2023-09-05 ENCOUNTER — Ambulatory Visit (HOSPITAL_BASED_OUTPATIENT_CLINIC_OR_DEPARTMENT_OTHER): Admission: EM | Admit: 2023-09-05 | Discharge: 2023-09-05 | Disposition: A | Discharge status: Home / Self Care

## 2023-09-05 ENCOUNTER — Encounter (HOSPITAL_BASED_OUTPATIENT_CLINIC_OR_DEPARTMENT_OTHER): Payer: Self-pay | Admitting: Emergency Medicine

## 2023-09-05 DIAGNOSIS — R1012 Left upper quadrant pain: Secondary | ICD-10-CM

## 2023-09-05 DIAGNOSIS — R1011 Right upper quadrant pain: Secondary | ICD-10-CM

## 2023-09-05 DIAGNOSIS — R110 Nausea: Secondary | ICD-10-CM

## 2023-09-05 DIAGNOSIS — J029 Acute pharyngitis, unspecified: Secondary | ICD-10-CM | POA: Diagnosis not present

## 2023-09-05 DIAGNOSIS — R051 Acute cough: Secondary | ICD-10-CM

## 2023-09-05 DIAGNOSIS — J069 Acute upper respiratory infection, unspecified: Secondary | ICD-10-CM

## 2023-09-05 LAB — POCT RAPID STREP A (OFFICE): Rapid Strep A Screen: NEGATIVE

## 2023-09-05 LAB — POC COVID19/FLU A&B COMBO
Covid Antigen, POC: NEGATIVE
Influenza A Antigen, POC: NEGATIVE
Influenza B Antigen, POC: NEGATIVE

## 2023-09-05 MED ORDER — ONDANSETRON 4 MG PO TBDP: 4.0000 mg | ORAL_TABLET | Freq: Three times a day (TID) | ORAL | 0 refills | Status: AC | PRN

## 2023-09-05 MED ORDER — PROMETHAZINE-DM 6.25-15 MG/5ML PO SYRP
5.0000 mL | ORAL_SOLUTION | Freq: Four times a day (QID) | ORAL | 0 refills | Status: DC | PRN
Start: 2023-09-05 — End: 2023-10-06
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
## 2023-09-05 NOTE — Discharge Instructions (Signed)
 Patient has a variety of symptoms including nausea without vomiting, abdominal pain, cough and sore throat.  She is negative for COVID-19, flu, strep.  At this point she appears in pretty good condition.  She is having problems caring for herself on her own and family is getting involved in providing her extra support.  She has been caring for her 3 grandchildren ages 46 and 34.  DSS has gotten involved in plans to take the children from her care because she is not able to care for herself and the children.    Will treat with Promethazine DM, 5 mL, every 6 hours as needed for cough.  Added ondansetron, 4 mg ODT every 8 hours if needed for nausea or vomiting.  Follow-up if symptoms do not improve, worsen or new symptoms occur.

## 2023-09-05 NOTE — ED Triage Notes (Signed)
 Patient presents with c/o productive cough, nasal congestion, dyspnea, headache and nausea x 4 days. Patient states she is taking tylenol for the symptoms.

## 2023-10-05 ENCOUNTER — Ambulatory Visit (HOSPITAL_BASED_OUTPATIENT_CLINIC_OR_DEPARTMENT_OTHER): Admission: EM | Admit: 2023-10-05 | Discharge: 2023-10-05 | Disposition: A | Discharge status: Home / Self Care

## 2023-10-05 ENCOUNTER — Encounter (HOSPITAL_BASED_OUTPATIENT_CLINIC_OR_DEPARTMENT_OTHER): Payer: Self-pay

## 2023-10-05 NOTE — ED Triage Notes (Signed)
 Dx with UTI on 4/11. Taking Bactrim and reports not improving. Presents for re-evaluation. Feeling pain at onset of urine stream and at the end.

## 2023-10-05 NOTE — ED Provider Notes (Signed)
 Patient unable to urinate and provide sample.  Clinical staff decided along with the patient that she will return tomorrow and attempt to provide a sample again.  This is reasonable as they report she is on antibiotic therapy for a UTI already and was simply requesting re-evaluation.    Genene Kennel, FNP 10/05/23 2010

## 2023-10-05 NOTE — ED Notes (Signed)
 Patient unable to provide urine specimen at this time. Will return tomorrow for full evaluation

## 2023-10-06 ENCOUNTER — Other Ambulatory Visit (HOSPITAL_BASED_OUTPATIENT_CLINIC_OR_DEPARTMENT_OTHER): Payer: Self-pay

## 2023-10-06 ENCOUNTER — Ambulatory Visit (HOSPITAL_BASED_OUTPATIENT_CLINIC_OR_DEPARTMENT_OTHER): Admitting: Radiology

## 2023-10-06 ENCOUNTER — Encounter (HOSPITAL_BASED_OUTPATIENT_CLINIC_OR_DEPARTMENT_OTHER): Payer: Self-pay

## 2023-10-06 ENCOUNTER — Ambulatory Visit (HOSPITAL_BASED_OUTPATIENT_CLINIC_OR_DEPARTMENT_OTHER)
Admission: EM | Admit: 2023-10-06 | Discharge: 2023-10-06 | Disposition: A | Discharge status: Home / Self Care | Attending: Physician Assistant | Admitting: Physician Assistant

## 2023-10-06 ENCOUNTER — Ambulatory Visit (HOSPITAL_BASED_OUTPATIENT_CLINIC_OR_DEPARTMENT_OTHER)
Admit: 2023-10-06 | Discharge: 2023-10-06 | Disposition: A | Discharge status: Home / Self Care | Attending: Physician Assistant | Admitting: Physician Assistant

## 2023-10-06 DIAGNOSIS — R109 Unspecified abdominal pain: Secondary | ICD-10-CM

## 2023-10-06 DIAGNOSIS — R1012 Left upper quadrant pain: Secondary | ICD-10-CM | POA: Diagnosis not present

## 2023-10-06 DIAGNOSIS — R8290 Unspecified abnormal findings in urine: Secondary | ICD-10-CM

## 2023-10-06 DIAGNOSIS — R1030 Lower abdominal pain, unspecified: Secondary | ICD-10-CM

## 2023-10-06 DIAGNOSIS — R10A0 Flank pain, unspecified side: Secondary | ICD-10-CM

## 2023-10-06 LAB — POCT URINALYSIS DIP (MANUAL ENTRY)
Blood, UA: NEGATIVE
Glucose, UA: NEGATIVE mg/dL
Leukocytes, UA: NEGATIVE
Nitrite, UA: NEGATIVE
Protein Ur, POC: 30 mg/dL — AB
Spec Grav, UA: 1.025
Urobilinogen, UA: 0.2 U/dL
pH, UA: 5.5

## 2023-10-06 MED ORDER — CEFTRIAXONE SODIUM 1 G IJ SOLR
1.0000 g | Freq: Once | INTRAMUSCULAR | Status: AC
  Administered 2023-10-06: 1 g via INTRAMUSCULAR

## 2023-10-06 MED ORDER — CIPROFLOXACIN HCL 500 MG PO TABS
500.0000 mg | ORAL_TABLET | Freq: Two times a day (BID) | ORAL | 0 refills | Status: AC
  Filled 2023-10-06: qty 10, 5d supply, fill #0

## 2023-10-06 NOTE — ED Triage Notes (Signed)
 Dx with UTI on 4/11. Taking Bactrim and reports not improving. Presents for re-evaluation. Feeling pain at onset of urine stream and at the end. Presented to Urgent Care on 4/16 and unable to provide specimen. Returns today with specimen. States still having low abd pain as well.
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
## 2023-10-06 NOTE — Discharge Instructions (Signed)
 I am glad that you are feeling better after the medication.  I am concerned that you have an infection in your bladder that might be going up towards her kidney.  Please start ciprofloxacin twice daily for 5 days.  This is similar to the Levaquin and moxifloxacin that you have had nausea with so please take it with food.  I will contact you if any of your blood work is abnormal.  I did not see a kidney stone on your x-ray but we will call you if the radiologist sees something else.  As we discussed, you do have a lot of stool and so I recommend that you increase fluid and use fiber or MiraLAX to encourage a regular bowel movement as this could be contributing to your discomfort.  Follow-up with urology soon as possible; call them to schedule an appointment soon as you leave.  If anything worsens and you have severe abdominal pain, fever, nausea, vomiting, difficulty passing urine you need to go to the emergency room.

## 2023-10-07 LAB — CBC WITH DIFFERENTIAL/PLATELET
Basophils Absolute: 0 10*3/uL (ref 0.0–0.2)
Basos: 1 %
EOS (ABSOLUTE): 0.1 10*3/uL (ref 0.0–0.4)
Eos: 2 %
Hematocrit: 43.3 % (ref 34.0–46.6)
Hemoglobin: 13.8 g/dL (ref 11.1–15.9)
Immature Grans (Abs): 0 10*3/uL (ref 0.0–0.1)
Immature Granulocytes: 0 %
Lymphocytes Absolute: 2.4 10*3/uL (ref 0.7–3.1)
Lymphs: 43 %
MCH: 26.6 pg (ref 26.6–33.0)
MCHC: 31.9 g/dL (ref 31.5–35.7)
MCV: 84 fL (ref 79–97)
Monocytes Absolute: 0.5 10*3/uL (ref 0.1–0.9)
Monocytes: 10 %
Neutrophils Absolute: 2.5 10*3/uL (ref 1.4–7.0)
Neutrophils: 44 %
Platelets: 153 10*3/uL (ref 150–450)
RBC: 5.18 x10E6/uL (ref 3.77–5.28)
RDW: 14.7 % (ref 11.7–15.4)
WBC: 5.6 10*3/uL (ref 3.4–10.8)

## 2023-10-07 LAB — BASIC METABOLIC PANEL WITH GFR
BUN/Creatinine Ratio: 15 (ref 12–28)
BUN: 18 mg/dL (ref 8–27)
CO2: 25 mmol/L (ref 20–29)
Calcium: 9.3 mg/dL (ref 8.7–10.3)
Chloride: 98 mmol/L (ref 96–106)
Creatinine, Ser: 1.23 mg/dL — ABNORMAL HIGH (ref 0.57–1.00)
Glucose: 81 mg/dL (ref 70–99)
Potassium: 4.1 mmol/L (ref 3.5–5.2)
Sodium: 139 mmol/L (ref 134–144)
eGFR: 49 mL/min/{1.73_m2} — ABNORMAL LOW (ref >59)

## 2023-10-09 LAB — URINE CULTURE: Culture: NO GROWTH

## 2024-01-31 ENCOUNTER — Ambulatory Visit (HOSPITAL_BASED_OUTPATIENT_CLINIC_OR_DEPARTMENT_OTHER): Payer: Self-pay | Admitting: Family Medicine

## 2024-01-31 ENCOUNTER — Ambulatory Visit (HOSPITAL_BASED_OUTPATIENT_CLINIC_OR_DEPARTMENT_OTHER): Admit: 2024-01-31 | Discharge: 2024-01-31 | Disposition: A | Discharge status: Home / Self Care | Admitting: Radiology

## 2024-01-31 ENCOUNTER — Ambulatory Visit (HOSPITAL_BASED_OUTPATIENT_CLINIC_OR_DEPARTMENT_OTHER): Admitting: Radiology

## 2024-01-31 ENCOUNTER — Encounter (HOSPITAL_BASED_OUTPATIENT_CLINIC_OR_DEPARTMENT_OTHER): Payer: Self-pay

## 2024-01-31 ENCOUNTER — Ambulatory Visit (HOSPITAL_BASED_OUTPATIENT_CLINIC_OR_DEPARTMENT_OTHER)
Admission: EM | Admit: 2024-01-31 | Discharge: 2024-01-31 | Disposition: A | Discharge status: Home / Self Care | Attending: Family Medicine | Admitting: Family Medicine

## 2024-01-31 DIAGNOSIS — R1012 Left upper quadrant pain: Secondary | ICD-10-CM | POA: Insufficient documentation

## 2024-01-31 DIAGNOSIS — R1031 Right lower quadrant pain: Secondary | ICD-10-CM

## 2024-01-31 DIAGNOSIS — R300 Dysuria: Secondary | ICD-10-CM | POA: Diagnosis not present

## 2024-01-31 DIAGNOSIS — R3989 Other symptoms and signs involving the genitourinary system: Secondary | ICD-10-CM

## 2024-01-31 DIAGNOSIS — R1030 Lower abdominal pain, unspecified: Secondary | ICD-10-CM | POA: Diagnosis present

## 2024-01-31 DIAGNOSIS — R1032 Left lower quadrant pain: Secondary | ICD-10-CM

## 2024-01-31 DIAGNOSIS — R102 Pelvic and perineal pain: Secondary | ICD-10-CM | POA: Insufficient documentation

## 2024-01-31 DIAGNOSIS — R1011 Right upper quadrant pain: Secondary | ICD-10-CM | POA: Diagnosis not present

## 2024-01-31 MED ORDER — D-MANNOSE 500 MG PO CAPS
ORAL_CAPSULE | ORAL | Status: AC
Start: 2024-01-31 — End: ?

## 2024-01-31 MED ORDER — ALOE VERA 25 MG PO CAPS: ORAL_CAPSULE | ORAL | 0 refills | Status: AC

## 2024-01-31 NOTE — ED Notes (Signed)
 Patients urinalysis unable to be performed due to patients excessive use of AZO.

## 2024-01-31 NOTE — ED Notes (Signed)
 Called for prior authorization for the CT Abd/Pelvis. Per the [facility] automatic system no prior auth was required.

## 2024-01-31 NOTE — Discharge Instructions (Addendum)
 Abdominal pain and bladder pain with dysuria: Unable to process the urinalysis due to the excessive discoloration from OTC Azo pills.  Urine culture sent.  Will adjust the plan of care, if needed once the culture results.  Get plenty of fluids.  Take d-mannose pills, 500 mg, 1-2 pills twice daily for prevention of UTI.  Avoid bladder irritants such as acidic foods and drinks, caffeine, chocolate, spicy foods.  Provided a handout on foods to avoid.  Use aloe vera 25 mg, 1 pill twice daily for bladder pain.  Abdominal x-ray showed a moderate amount of gas in the bowel and some stool but was otherwise normal.  CT of the abdomen and pelvis has not been read by radiology but appears normal.  Will update the patient once the radiologist has read the films .  Follow-up with urology as planned.  Return here if needed.
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
## 2024-01-31 NOTE — Progress Notes (Signed)
 CT Scan IMPRESSION: 1. No acute or active process within the abdomen or pelvis. 2. Evidence of prior cholecystectomy, appendectomy and hysterectomy. 3. 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 4. Aortic atherosclerosis.  Patient not available but I was able to leave a voicemail updating her on these results.  This is essentially the results we discussed while she was here during the visit.  If she continues to have abdominal pain she should follow-up with her family doctor or even with a urologist.  All of this was left on voicemail.

## 2024-01-31 NOTE — ED Triage Notes (Addendum)
 Patient reports low abd pain x 1.5 months. States hx of uti's. Has a urologist. Taking AZO. Denies vaginal itching. Only pain when attempting to urinate. Attempted to get an appointment on 8/6 with urologist and has not heard back from them. Abd distention present. States has used almost 3 boxes of AZO in last 60 days. Took tylenol  this morning for pain. Patient extremely guarded when abd palpated.

## 2024-02-01 ENCOUNTER — Ambulatory Visit (HOSPITAL_BASED_OUTPATIENT_CLINIC_OR_DEPARTMENT_OTHER): Admitting: Student

## 2024-02-02 LAB — URINE CULTURE

## 2024-02-06 ENCOUNTER — Ambulatory Visit (HOSPITAL_BASED_OUTPATIENT_CLINIC_OR_DEPARTMENT_OTHER): Admitting: Student

## 2024-03-08 ENCOUNTER — Ambulatory Visit (HOSPITAL_BASED_OUTPATIENT_CLINIC_OR_DEPARTMENT_OTHER): Admitting: Student

## 2024-04-12 ENCOUNTER — Ambulatory Visit: Admitting: Physician Assistant

## 2024-05-29 ENCOUNTER — Ambulatory Visit: Admitting: Gastroenterology
